# Patient Record
Sex: Male | Born: 1970 | Race: Black or African American | Hispanic: No | Marital: Single | Smoking: Former smoker
Health system: Southern US, Community
[De-identification: ages and names within clinical notes are randomized; demographics above are authoritative.]

## PROBLEM LIST (undated history)

## (undated) DIAGNOSIS — E7800 Pure hypercholesterolemia, unspecified: Secondary | ICD-10-CM

## (undated) DIAGNOSIS — I509 Heart failure, unspecified: Secondary | ICD-10-CM

## (undated) DIAGNOSIS — I10 Essential (primary) hypertension: Secondary | ICD-10-CM

## (undated) DIAGNOSIS — I5022 Chronic systolic (congestive) heart failure: Secondary | ICD-10-CM

---

## 2018-05-12 DIAGNOSIS — F1410 Cocaine abuse, uncomplicated: Secondary | ICD-10-CM | POA: Diagnosis present

## 2019-11-23 DIAGNOSIS — M5412 Radiculopathy, cervical region: Secondary | ICD-10-CM | POA: Insufficient documentation

## 2019-11-23 DIAGNOSIS — I10 Essential (primary) hypertension: Secondary | ICD-10-CM | POA: Diagnosis present

## 2019-11-23 DIAGNOSIS — M5416 Radiculopathy, lumbar region: Secondary | ICD-10-CM | POA: Insufficient documentation

## 2020-02-10 DIAGNOSIS — E7800 Pure hypercholesterolemia, unspecified: Secondary | ICD-10-CM | POA: Diagnosis present

## 2020-05-15 DIAGNOSIS — I441 Atrioventricular block, second degree: Secondary | ICD-10-CM | POA: Insufficient documentation

## 2020-05-18 DIAGNOSIS — I5022 Chronic systolic (congestive) heart failure: Secondary | ICD-10-CM | POA: Diagnosis present

## 2020-05-18 DIAGNOSIS — I5020 Unspecified systolic (congestive) heart failure: Secondary | ICD-10-CM | POA: Diagnosis present

## 2020-05-18 HISTORY — PX: CARDIAC CATHETERIZATION: SHX172

## 2020-08-23 ENCOUNTER — Other Ambulatory Visit: Payer: Self-pay | Admitting: Registered Nurse

## 2020-08-23 ENCOUNTER — Ambulatory Visit (HOSPITAL_COMMUNITY)
Admission: EM | Admit: 2020-08-23 | Discharge: 2020-08-23 | Disposition: A | Discharge status: Other Acute Inpatient Hospital | Payer: No Typology Code available for payment source | Attending: Psychiatry | Admitting: Psychiatry

## 2020-08-23 ENCOUNTER — Emergency Department (HOSPITAL_COMMUNITY)
Admission: EM | Admit: 2020-08-23 | Discharge: 2020-08-25 | Disposition: A | Discharge status: Other Acute Inpatient Hospital | Payer: No Typology Code available for payment source | Attending: Emergency Medicine | Admitting: Emergency Medicine

## 2020-08-23 ENCOUNTER — Emergency Department (HOSPITAL_COMMUNITY): Payer: No Typology Code available for payment source

## 2020-08-23 ENCOUNTER — Other Ambulatory Visit: Payer: Self-pay

## 2020-08-23 DIAGNOSIS — F1014 Alcohol abuse with alcohol-induced mood disorder: Secondary | ICD-10-CM | POA: Insufficient documentation

## 2020-08-23 DIAGNOSIS — Z20822 Contact with and (suspected) exposure to covid-19: Secondary | ICD-10-CM | POA: Insufficient documentation

## 2020-08-23 DIAGNOSIS — Y906 Blood alcohol level of 120-199 mg/100 ml: Secondary | ICD-10-CM | POA: Insufficient documentation

## 2020-08-23 DIAGNOSIS — R45851 Suicidal ideations: Secondary | ICD-10-CM | POA: Insufficient documentation

## 2020-08-23 DIAGNOSIS — F1410 Cocaine abuse, uncomplicated: Secondary | ICD-10-CM

## 2020-08-23 DIAGNOSIS — Z0279 Encounter for issue of other medical certificate: Secondary | ICD-10-CM | POA: Diagnosis present

## 2020-08-23 DIAGNOSIS — R079 Chest pain, unspecified: Secondary | ICD-10-CM

## 2020-08-23 DIAGNOSIS — R454 Irritability and anger: Secondary | ICD-10-CM | POA: Diagnosis not present

## 2020-08-23 DIAGNOSIS — R0789 Other chest pain: Secondary | ICD-10-CM | POA: Insufficient documentation

## 2020-08-23 DIAGNOSIS — F10920 Alcohol use, unspecified with intoxication, uncomplicated: Secondary | ICD-10-CM

## 2020-08-23 DIAGNOSIS — R45850 Homicidal ideations: Secondary | ICD-10-CM | POA: Insufficient documentation

## 2020-08-23 DIAGNOSIS — F1024 Alcohol dependence with alcohol-induced mood disorder: Secondary | ICD-10-CM | POA: Insufficient documentation

## 2020-08-23 DIAGNOSIS — F419 Anxiety disorder, unspecified: Secondary | ICD-10-CM | POA: Insufficient documentation

## 2020-08-23 DIAGNOSIS — F1094 Alcohol use, unspecified with alcohol-induced mood disorder: Secondary | ICD-10-CM | POA: Diagnosis not present

## 2020-08-23 DIAGNOSIS — F1020 Alcohol dependence, uncomplicated: Secondary | ICD-10-CM | POA: Diagnosis present

## 2020-08-23 DIAGNOSIS — F1994 Other psychoactive substance use, unspecified with psychoactive substance-induced mood disorder: Secondary | ICD-10-CM

## 2020-08-23 DIAGNOSIS — F1021 Alcohol dependence, in remission: Secondary | ICD-10-CM

## 2020-08-23 LAB — COMPREHENSIVE METABOLIC PANEL
ALT: 41 U/L (ref 0–44)
AST: 64 U/L — ABNORMAL HIGH (ref 15–41)
Albumin: 4.3 g/dL (ref 3.5–5.0)
Alkaline Phosphatase: 81 U/L (ref 38–126)
Anion gap: 11 (ref 5–15)
BUN: 11 mg/dL (ref 6–20)
CO2: 23 mmol/L (ref 22–32)
Calcium: 9.3 mg/dL (ref 8.9–10.3)
Chloride: 102 mmol/L (ref 98–111)
Creatinine, Ser: 1.29 mg/dL — ABNORMAL HIGH (ref 0.61–1.24)
GFR, Estimated: >60 mL/min (ref >60)
Glucose, Bld: 82 mg/dL (ref 70–99)
Potassium: 3.7 mmol/L (ref 3.5–5.1)
Sodium: 136 mmol/L (ref 135–145)
Total Bilirubin: 1 mg/dL (ref 0.3–1.2)
Total Protein: 8.1 g/dL (ref 6.5–8.1)

## 2020-08-23 LAB — CBC WITH DIFFERENTIAL/PLATELET
Abs Immature Granulocytes: 0.02 10*3/uL (ref 0.00–0.07)
Basophils Absolute: 0 10*3/uL (ref 0.0–0.1)
Basophils Relative: 0 %
Eosinophils Absolute: 0.1 10*3/uL (ref 0.0–0.5)
Eosinophils Relative: 1 %
HCT: 47.2 % (ref 39.0–52.0)
Hemoglobin: 15.8 g/dL (ref 13.0–17.0)
Immature Granulocytes: 0 %
Lymphocytes Relative: 26 %
Lymphs Abs: 2.1 10*3/uL (ref 0.7–4.0)
MCH: 31.9 pg (ref 26.0–34.0)
MCHC: 33.5 g/dL (ref 30.0–36.0)
MCV: 95.4 fL (ref 80.0–100.0)
Monocytes Absolute: 0.9 10*3/uL (ref 0.1–1.0)
Monocytes Relative: 12 %
Neutro Abs: 4.8 10*3/uL (ref 1.7–7.7)
Neutrophils Relative %: 61 %
Platelets: 170 10*3/uL (ref 150–400)
RBC: 4.95 MIL/uL (ref 4.22–5.81)
RDW: 13.7 % (ref 11.5–15.5)
WBC: 7.9 10*3/uL (ref 4.0–10.5)
nRBC: 0 % (ref 0.0–0.2)

## 2020-08-23 LAB — RESP PANEL BY RT-PCR (FLU A&B, COVID) ARPGX2
Influenza A by PCR: NEGATIVE
Influenza B by PCR: NEGATIVE
SARS Coronavirus 2 by RT PCR: NEGATIVE

## 2020-08-23 LAB — TROPONIN I (HIGH SENSITIVITY)
Troponin I (High Sensitivity): 9 ng/L (ref <18)
Troponin I (High Sensitivity): 8 ng/L (ref <18)

## 2020-08-23 LAB — ETHANOL: Alcohol, Ethyl (B): 189 mg/dL — ABNORMAL HIGH (ref <10)

## 2020-08-23 LAB — ACETAMINOPHEN LEVEL: Acetaminophen (Tylenol), Serum: <10 ug/mL — ABNORMAL LOW (ref 10–30)

## 2020-08-23 LAB — SALICYLATE LEVEL: Salicylate Lvl: <7 mg/dL — ABNORMAL LOW (ref 7.0–30.0)

## 2020-08-23 MED ORDER — LORAZEPAM 1 MG PO TABS
0.0000 mg | ORAL_TABLET | Freq: Four times a day (QID) | ORAL | Status: DC
  Administered 2020-08-23: 1 mg via ORAL
  Filled 2020-08-23: qty 1

## 2020-08-23 MED ORDER — ACETAMINOPHEN 325 MG PO TABS
650.0000 mg | ORAL_TABLET | ORAL | Status: DC | PRN
  Administered 2020-08-24: 650 mg via ORAL
  Filled 2020-08-23: qty 2

## 2020-08-23 MED ORDER — THIAMINE HCL 100 MG/ML IJ SOLN: 100.0000 mg | Freq: Every day | INTRAMUSCULAR | Status: DC

## 2020-08-23 MED ORDER — LORAZEPAM 2 MG/ML IJ SOLN: 0.0000 mg | Freq: Four times a day (QID) | INTRAMUSCULAR | Status: DC

## 2020-08-23 MED ORDER — LORAZEPAM 1 MG PO TABS: 0.0000 mg | ORAL_TABLET | Freq: Two times a day (BID) | ORAL | Status: DC

## 2020-08-23 MED ORDER — THIAMINE HCL 100 MG PO TABS
100.0000 mg | ORAL_TABLET | Freq: Every day | ORAL | Status: DC
  Administered 2020-08-23 – 2020-08-25 (×3): 100 mg via ORAL
  Filled 2020-08-23 (×3): qty 1

## 2020-08-23 MED ORDER — LORAZEPAM 2 MG/ML IJ SOLN: 0.0000 mg | Freq: Two times a day (BID) | INTRAMUSCULAR | Status: DC

## 2020-08-23 MED ORDER — NICOTINE 21 MG/24HR TD PT24: 21.0000 mg | MEDICATED_PATCH | TRANSDERMAL | Status: DC | PRN

## 2020-08-23 MED ORDER — ALUM & MAG HYDROXIDE-SIMETH 200-200-20 MG/5ML PO SUSP: 30.0000 mL | Freq: Four times a day (QID) | ORAL | Status: DC | PRN

## 2020-08-23 MED ORDER — ONDANSETRON HCL 4 MG PO TABS: 4.0000 mg | ORAL_TABLET | Freq: Three times a day (TID) | ORAL | Status: DC | PRN

## 2020-08-23 NOTE — Discharge Instructions (Addendum)
Patient is being tranported to the [facility] via safe transport

## 2020-08-23 NOTE — BH Assessment (Signed)
TTS triage: Patient presents to [facility] via law enforcement. He is tearful and states he has felt suicidal since 02/21/2022 when his nephew passed away. He states earlier today he thought of overdosing or jumping in front of a train. He denies HI/AVH. He reports having 3-4 years of sobriety until last Friday when he relapsed on alcohol. He also states he used some cocaine. He drank 1/2 gallon of liquor PTA.  Patient is emergent. Vernard Gambles, PMHNP to see to determine if he needs to go to ED.

## 2020-08-23 NOTE — ED Provider Notes (Signed)
Emergency Department Provider Note   I have reviewed the triage vital signs and the nursing notes.   HISTORY  Chief Complaint Suicidal   HPI Bruce Moreno is a 50 y.o. male with PMH of HTN and polysubstance abuse to the emergency department from [facility] for medical clearance.  He was brought there voluntarily by [employer] after they found him tearful and describing both suicidal and some homicidal ideation toward his wife.  He described relapsing on alcohol recently after several years sober.  He also endorses using cocaine over the weekend.  He describes some chest discomfort but tells me that was earlier this morning he is not having any active chest pain or trouble breathing.  No fevers or chills.  He has been drinking very heavily over the past several days drinking up to 1/2 gallon and in a 24-hour period. Plan from psychiatry team is AM re-evaluation once medically clear.   No past medical history on file.  There are no problems to display for this patient.   Allergies Patient has no known allergies.  No family history on file.  Social History    Review of Systems  Constitutional: No fever/chills Eyes: No visual changes. ENT: No sore throat. Cardiovascular: Positive chest pain (resolved).  Respiratory: Denies shortness of breath. Gastrointestinal: No abdominal pain.  No nausea, no vomiting.  No diarrhea.  No constipation. Genitourinary: Negative for dysuria. Musculoskeletal: Negative for back pain. Skin: Negative for rash. Neurological: Negative for headaches, focal weakness or numbness. Psychiatric: Positive SI/HI.   10-point ROS otherwise negative.  ____________________________________________   PHYSICAL EXAM:  VITAL SIGNS: ED Triage Vitals [08/23/20 1745]  Enc Vitals Group     BP (!) 136/92     Pulse Rate (!) 102     Resp 14     Temp 99.1 F (37.3 C)     Temp Source Oral     SpO2 97 %   Constitutional: Alert and oriented. Well appearing and in no acute  distress. Eyes: Conjunctivae are normal.  Head: Atraumatic. Nose: No congestion/rhinnorhea. Mouth/Throat: Mucous membranes are moist.   Neck: No stridor.   Cardiovascular: Normal rate, regular rhythm. Good peripheral circulation. Grossly normal heart sounds.   Respiratory: Normal respiratory effort.  No retractions. Lungs CTAB. Gastrointestinal: No distention.  Musculoskeletal: No gross deformities of extremities. Neurologic:  Normal speech and language. No gross focal neurologic deficits are appreciated.  Skin:  Skin is warm, dry and intact. No rash noted. Psychiatric: Poor eye contact. Intermittently agitated but able to re-direct. Speech somewhat pressured at times.   ____________________________________________   LABS (all labs ordered are listed, but only abnormal results are displayed)  Labs Reviewed  COMPREHENSIVE METABOLIC PANEL - Abnormal; Notable for the following components:      Result Value   Creatinine, Ser 1.29 (*)    AST 64 (*)    All other components within normal limits  ETHANOL - Abnormal; Notable for the following components:   Alcohol, Ethyl (B) 189 (*)    All other components within normal limits  ACETAMINOPHEN LEVEL - Abnormal; Notable for the following components:   Acetaminophen (Tylenol), Serum <10 (*)    All other components within normal limits  SALICYLATE LEVEL - Abnormal; Notable for the following components:   Salicylate Lvl <7.0 (*)    All other components within normal limits  RESP PANEL BY RT-PCR (FLU A&B, COVID) [id number]  CBC WITH DIFFERENTIAL/PLATELET  RAPID URINE DRUG SCREEN, HOSP PERFORMED  TROPONIN I (HIGH SENSITIVITY)  TROPONIN I (  HIGH SENSITIVITY)   ____________________________________________  EKG   EKG Interpretation  Date/Time:  Tuesday August 23 2020 17:37:31 EDT Ventricular Rate:  104 PR Interval:  138 QRS Duration: 86 QT Interval:  366 QTC Calculation: 481 R Axis:   96 Text Interpretation: Sinus tachycardia Right atrial  enlargement Rightward axis Pulmonary disease pattern Biventricular hypertrophy Abnormal ECG No old tracing to compare Confirmed by Alona Bene [phone]) on 08/23/2020 6:50:05 PM        ____________________________________________  RADIOLOGY  DG Chest 2 View  Result Date: 08/23/2020 CLINICAL DATA:  Suicidal thoughts. EXAM: CHEST - 2 VIEW COMPARISON:  None. FINDINGS: The lungs are mildly hyperinflated. There is no evidence of acute infiltrate, pleural effusion or pneumothorax. The cardiac silhouette is borderline in size. The visualized skeletal structures are unremarkable. IMPRESSION: No active cardiopulmonary disease. Electronically Signed   By: Aram Candela M.D.   On: 08/23/2020 19:24    ____________________________________________   PROCEDURES  Procedure(s) performed:   Procedures  None  ____________________________________________   INITIAL IMPRESSION / ASSESSMENT AND PLAN / ED COURSE  Pertinent labs & imaging results that were available during my care of the patient were reviewed by me and considered in my medical decision making (see chart for details).   Patient presents to the emergency department from behavioral health urgent care.  They evaluated the patient and plan to reevaluate in the morning.  He was sent here for medical clearance.  Chest pain was earlier today.  EKG shows some sinus tachycardia but no acute ischemic change.  Plan for chest x-ray and serial troponins along with COVID swab. Will monitor CIWA but by history only returned to drinking in the last several days after years of sobriety. Low risk for complicated EtOH withdrawal but will monitor.   CXR and labs reviewed. Med clear for psychiatry disposition.  ____________________________________________  FINAL CLINICAL IMPRESSION(S) / ED DIAGNOSES  Final diagnoses:  Chest pain     MEDICATIONS GIVEN DURING THIS VISIT:  Medications  LORazepam (ATIVAN) injection 0-4 mg (0 mg Intravenous Not Given  08/24/20 0800)    Or  LORazepam (ATIVAN) tablet 0-4 mg ( Oral See Alternative 08/24/20 0800)  LORazepam (ATIVAN) injection 0-4 mg (has no administration in time range)    Or  LORazepam (ATIVAN) tablet 0-4 mg (has no administration in time range)  thiamine tablet 100 mg (100 mg Oral Given 08/24/20 1043)    Or  thiamine (B-1) injection 100 mg ( Intravenous See Alternative 08/24/20 1043)  nicotine (NICODERM CQ - dosed in mg/24 hours) patch 21 mg (has no administration in time range)  alum & mag hydroxide-simeth (MAALOX/MYLANTA) 200-200-20 MG/5ML suspension 30 mL (has no administration in time range)  ondansetron (ZOFRAN) tablet 4 mg (has no administration in time range)  acetaminophen (TYLENOL) tablet 650 mg (has no administration in time range)    Note:  This document was prepared using Dragon voice recognition software and may include unintentional dictation errors.  Alona Bene, MD, [facility] Emergency Medicine    Aaliyana Fredericks, Arlyss Repress, MD 08/24/20 1115

## 2020-08-23 NOTE — ED Notes (Signed)
Safe transport called for transportation services to [facility]. Safety maintained.

## 2020-08-23 NOTE — ED Triage Notes (Signed)
Pt here with c/o SI and etoh abuse along with some cp

## 2020-08-23 NOTE — ED Provider Notes (Signed)
Emergency Medicine Provider Triage Evaluation Note  Bruce Moreno , a 50 y.o. male  was evaluated in triage.  Pt complains of si with plan to drink himself to death. States he has no will to live after his father passed, he has been drinking daily for 4 days. He also did some cocaine. He had cp that has resolved.  Review of Systems  Positive: Chest pain (resolved), si Negative: hi  Physical Exam  BP (!) 136/92   Pulse (!) 102   Temp 99.1 F (37.3 C) (Oral)   Resp 14   SpO2 97%  Gen:   Awake, no distress   Resp:  Normal effort  MSK:   Moves extremities without difficulty  Other:  Appears intoxicated, tearful  Medical Decision Making  Medically screening exam initiated at 5:51 PM.  Appropriate orders placed.  Bruce Moreno was informed that the remainder of the evaluation will be completed by another provider, this initial triage assessment does not replace that evaluation, and the importance of remaining in the ED until their evaluation is complete.     Karrie Meres, [place] 08/23/20 1753    Terald Sleeper, MD 08/24/20 [phone]

## 2020-08-23 NOTE — BH Assessment (Signed)
Comprehensive Clinical Assessment (CCA) Note  08/23/2020 Bruce Moreno [id number]  Patient is a 50 year old male presenting voluntarily to [facility] via [employer]. Patient is tearful upon arrival. He states, "I can't do it anymore. I don't want to be alive anymore." He indicated this morning he was considering overdosing on his blood pressure medication or jumping in front a train. Patient states he has felt increasingly depressed and suicidal since his nephew passed away in 04-01-22. He states he passed of heart disease and his wife made the decision to "pull the plug." He endorses passive HI toward her. He states he was clean and sober for 3-4 years but on Friday relapsed on alcohol and has been "on a binge" since. He reports drinking a half gallon of liquor earlier this date. He also states he used crack cocaine for the first time over the weekend. He states, "I've got to get some help. I don't want to live anymore." Patient denies AVH. He denies any prior mental health treatment.   Vernard Gambles, PMHNP recommends patient be transferred to ED for overnight observation and stabilization.  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 Legal System  What Is the Reason for Your Visit/Call Today? suicidal  How Long Has This Been Causing You Problems? > than 6 months  What Do You Feel Would Help You the Most Today? Treatment for Depression or other mood problem; Stress Management; Medication(s)   Have You Recently Had Any Thoughts About Hurting Yourself? Yes  Are You Planning to Commit Suicide/Harm Yourself At This time? Yes   Have you Recently Had Thoughts About Hurting Someone Karolee Ohs? No  Are You Planning to Harm Someone at This Time? No  Explanation: No data recorded  Have You Used Any Alcohol or Drugs in the Past 24 Hours? Yes  How Long Ago Did You  Use Drugs or Alcohol? No data recorded What Did You Use and How Much? 1/2 gallon of liquor   Do You Currently Have a Therapist/Psychiatrist? No  Name of Therapist/Psychiatrist: No data recorded  Have You Been Recently Discharged From Any Office Practice or Programs? No  Explanation of Discharge From Practice/Program: No data recorded    CCA Screening Triage Referral Assessment Type of Contact: Face-to-Face  Telemedicine Service Delivery:   Is this Initial or Reassessment? No data recorded Date Telepsych consult ordered in CHL:  No data recorded Time Telepsych consult ordered in CHL:  No data recorded Location of Assessment: [facility] Assessment Services  Provider Location: [facility] Assessment Services   Collateral Involvement: NA   Does Patient Have a Automotive engineer Guardian? No data recorded Name and Contact of Legal Guardian: No data recorded If Minor and Not Living with Parent(s), Who has Custody? No data recorded Is CPS involved or ever been involved? Never  Is APS involved or ever been involved? Never   Patient Determined To Be At Risk for Harm To Self or Others Based on Review of Patient Reported Information or Presenting Complaint? Yes, for Self-Harm  Method: No data recorded Availability of Means: No data recorded Intent: No data recorded Notification Required: No data recorded Additional Information for Danger to Others Potential: No data recorded Additional Comments for Danger to Others Potential: No data recorded Are There Guns or Other Weapons in Your Home? No data recorded Types of Guns/Weapons: No data recorded Are These Weapons  Safely Secured?                            No data recorded Who Could Verify You Are Able To Have These Secured: No data recorded Do You Have any Outstanding Charges, Pending Court Dates, Parole/Probation? No data recorded Contacted To Inform of Risk of Harm To Self or Others: No data recorded   Does Patient Present under  Involuntary Commitment? No  IVC Papers Initial File Date: No data recorded  [place] of Residence: Other (Comment) (orange)   Patient Currently Receiving the Following Services: Not Receiving Services   Determination of Need: Emergent (2 hours)   Options For Referral: Medication Management; Inpatient Hospitalization; BH Urgent Care     CCA Biopsychosocial Patient Reported Schizophrenia/Schizoaffective Diagnosis in Past: No   Strengths: motivated for treatment   Mental Health Symptoms Depression:   Change in energy/activity; Difficulty Concentrating; Fatigue; Hopelessness; Weight gain/loss; Tearfulness; Sleep (too much or little); Irritability; Increase/decrease in appetite; Worthlessness   Duration of Depressive symptoms:  Duration of Depressive Symptoms: Greater than two weeks   Mania:   None   Anxiety:    None   Psychosis:   None   Duration of Psychotic symptoms:    Trauma:   None   Obsessions:   None   Compulsions:   None   Inattention:   N/A   Hyperactivity/Impulsivity:   N/A   Oppositional/Defiant Behaviors:   N/A   Emotional Irregularity:   N/A   Other Mood/Personality Symptoms:  No data recorded   Mental Status Exam Appearance and self-care  Stature:   Average   Weight:   Average weight   Clothing:   Neat/clean   Grooming:   Normal   Cosmetic use:   None   Posture/gait:   Slumped   Motor activity:   Not Remarkable   Sensorium  Attention:   Persistent   Concentration:   Preoccupied   Orientation:   X5   Recall/memory:   Normal   Affect and Mood  Affect:   Tearful; Depressed   Mood:   Depressed   Relating  Eye contact:   Normal   Facial expression:   Responsive   Attitude toward examiner:   Cooperative   Thought and Language  Speech flow:  Clear and Coherent   Thought content:   Appropriate to Mood and Circumstances   Preoccupation:   None   Hallucinations:   None   Organization:  No  data recorded  [employer] of Knowledge:   Good   Intelligence:   Average   Abstraction:   Normal   Judgement:   Impaired   Reality Testing:   Distorted   Insight:   Lacking   Decision Making:   Impulsive   Social Functioning  Social Maturity:   Responsible   Social Judgement:   Normal   Stress  Stressors:   Grief/losses; Illness   Coping Ability:   Deficient supports   Skill Deficits:   None   Supports:   Support needed     Religion: Religion/Spirituality Are You A Religious Person?: No  Leisure/Recreation: Leisure / Recreation Do You Have Hobbies?: No  Exercise/Diet: Exercise/Diet Do You Exercise?: No Have You Gained or Lost A Significant Amount of Weight in the Past Six Months?: No Do You Follow a Special Diet?: No Do You Have Any Trouble Sleeping?: No   CCA Employment/Education Employment/Work Situation: Employment / Work Situation Employment Situation:  (  UTA) Patient's Job has Been Impacted by Current Illness:  (UTA) Has Patient ever Been in the [employer]?:  (UTA)  Education: Education Is Patient Currently Attending School?:  (UTA) Last Grade Completed:  (UTA) Did You Attend College?:  (UTA) Did You Have An Individualized Education Program (IIEP):  (UTA) Did You Have Any Difficulty At School?:  (UTA) Patient's Education Has Been Impacted by Current Illness:  (UTA)   CCA Family/Childhood History Family and Relationship History: Family history Marital status: Single Does patient have children?: Yes How many children?: 1 How is patient's relationship with their children?: estranged daughter  Childhood History:  Childhood History By whom was/is the patient raised?:  (UTA) Did patient suffer any verbal/emotional/physical/sexual abuse as a child?: No Did patient suffer from severe childhood neglect?: No Has patient ever been sexually abused/assaulted/raped as an adolescent or adult?: No Was the patient ever a  victim of a crime or a disaster?: No Witnessed domestic violence?: No Has patient been affected by domestic violence as an adult?: No  Child/Adolescent Assessment:     CCA Substance Use Alcohol/Drug Use: Alcohol / Drug Use Pain Medications: see MAR Prescriptions: see MAR Over the Counter: see <AR History of alcohol / drug use?: Yes Longest period of sobriety (when/how long): 3-4 years, relapsed on 7/29 Substance #1 Name of Substance 1: alcohol- relapsed on Friday                       ASAM's:  Six Dimensions of Multidimensional Assessment  Dimension 1:  Acute Intoxication and/or Withdrawal Potential:      Dimension 2:  Biomedical Conditions and Complications:      Dimension 3:  Emotional, Behavioral, or Cognitive Conditions and Complications:     Dimension 4:  Readiness to Change:     Dimension 5:  Relapse, Continued use, or Continued Problem Potential:     Dimension 6:  Recovery/Living Environment:     ASAM Severity Score:    ASAM Recommended Level of Treatment:     Substance use Disorder (SUD)    Recommendations for Services/Supports/Treatments:    Discharge Disposition:    DSM5 Diagnoses: There are no problems to display for this patient.    Referrals to Alternative Service(s): Referred to Alternative Service(s):   Place:   Date:   Time:    Referred to Alternative Service(s):   Place:   Date:   Time:    Referred to Alternative Service(s):   Place:   Date:   Time:    Referred to Alternative Service(s):   Place:   Date:   Time:     Celedonio Miyamoto, LCSW

## 2020-08-23 NOTE — ED Notes (Signed)
Pt transferred to [facility] via safe transport. Safety maintained.

## 2020-08-23 NOTE — ED Provider Notes (Signed)
Behavioral Health Urgent Care Medical Screening Exam  Patient Name: Bruce Moreno MRN: [id number] Date of Evaluation: 08/23/20 Chief Complaint:   Diagnosis:  Final diagnoses:  Alcohol abuse with alcohol-induced mood disorder (HCC)    History of Present illness: Bruce Moreno is a 50 y.o. male patient presented to [facility] voluntarily as a walk in  accompanied by [employer] with complaints of "if I don't get some help I am going to kill myself before this day ends".  Bruce Moreno, 50 y.o., male patient seen face to face by this provider, consulted with Bruce Moreno; and chart reviewed on 08/23/20.  On evaluation Bruce Moreno reports he has been sober for 3 years.  States 1 week ago he started binge drinking.  States, "I drink a lot every day I do not know how much".  States today he drank one half a gallon of liquor.  Reports he is also using "crack".  Reports since he lost his nephew in January his depression has worsened.  During evaluation Bruce Moreno is in sitting position in no acute distress. He is inattentive and easily distracted.  He is disheveled, with dirty clothes.  Patient appears intoxicated.  He makes fleeting eye contact. His speech is rapid and pressured at times, but clear and coherent. He gets agitated with questions and states, "are yall going to help me or not".  He is alert, oriented x 4, anxious, but cooperative.  His mood is depressed with congruent affect.  He does not appear to be responding to internal/external stimuli or delusional thoughts. Denies auditory and visual hallucinations. Patient denies self-harm/homicidal ideation, psychosis, and paranoia.  Patient endorses suicidal ideations states, "I am just sick of his life I am sick of it all".  Patient would not elaborate on any specific plan with this writer states, "does it matter".  Then states,"if I don't get some help I am going to kill myself before this day ends".   Per Bruce Miyamoto Bruce Moreno note on 08/23/2020 during TTS  assessment patient endorsed a specific plan for suicide. "He states earlier today he thought of overdosing or jumping in front of a train".  Patient reports he does have congestive heart failure.  Patients feet and ankles had no signs of edema.  Patient denies chest pain.  Patient states he has been living in a hotel. States he lives in [place].   Psychiatric Specialty Exam  Presentation  General Appearance:Disheveled  Eye Contact:Fleeting  Speech:Clear and Coherent; Pressured  Speech Volume:Normal  Handedness:Right   Mood and Affect  Mood:Anxious; Depressed; Irritable  Affect:Congruent   Thought Process  Thought Processes:Coherent  Descriptions of Associations:Intact  Orientation:Full (Time, Place and Person)  Thought Content:Logical    Hallucinations:None  Ideas of Reference:None  Suicidal Thoughts:Yes, Active With Intent (patient will not answer if he has a plan or means)  Homicidal Thoughts:No   Sensorium  Memory:Immediate Fair; Recent Fair; Remote Fair  Judgment:Poor  Insight:Poor   Executive Functions  Concentration:Fair  Attention Span:Poor  Recall:Fair  Fund of Knowledge:Fair  Language:Fair   Psychomotor Activity  Psychomotor Activity:Normal   Assets  Assets:Communication Skills; Desire for Improvement; Resilience   Sleep  Sleep:Fair  Number of hours:  No data recorded  No data recorded  Physical Exam: Physical Exam HENT:     Head: Normocephalic.     Nose: No congestion.  Eyes:     General:        Right eye: No discharge.        Left eye: No  discharge.     Conjunctiva/sclera: Conjunctivae normal.  Cardiovascular:     Rate and Rhythm: Tachycardia present.  Pulmonary:     Effort: Pulmonary effort is normal. No respiratory distress.  Musculoskeletal:        General: No swelling. Normal range of motion.     Cervical back: Normal range of motion.  Skin:    General: Skin is warm and dry.     Coloration: Skin is  not jaundiced or pale.  Neurological:     Mental Status: He is alert and oriented to person, place, and time.  Psychiatric:        Attention and Perception: Perception normal. He is inattentive.        Mood and Affect: Mood is anxious and depressed.        Speech: Speech is rapid and pressured (at times).        Behavior: Behavior is agitated.        Thought Content: Thought content includes suicidal ideation. Thought content includes suicidal plan.        Cognition and Memory: Cognition normal.        Judgment: Judgment is impulsive.   Review of Systems  Constitutional: Negative.  Negative for chills and fever.  HENT: Negative.  Negative for hearing loss.   Respiratory: Negative.  Negative for cough.   Cardiovascular: Negative.  Negative for chest pain.  Musculoskeletal: Negative.   Skin: Negative.   Neurological: Negative.   Psychiatric/Behavioral:  Positive for depression. The patient is nervous/anxious.   Blood pressure 125/88, pulse (!) 125, temperature 98.7 F (37.1 C), resp. rate 16, SpO2 96 %. There is no height or weight on file to calculate BMI.  Musculoskeletal: Strength & Muscle Tone: within normal limits Gait & Station: normal Patient leans: N/A  [facility] MSE Discharge Disposition for Follow up and Recommendations: Based on my evaluation the patient appears to have an emergency medical condition for which I recommend the patient be transferred to the emergency department for further evaluation.   Patient will be transferred to [facility] ED for medical clearance, due to patient reports he drank one half a gallon of liquor, elevated BP and heart rate.   Patient does not meet the requirements for [facility], patient states he lives in [place] and is not a [facility] resident.  States he has a brother that lives here in [place] that he visits from time to time. That could be the address he gave for registration.  Recommendation of overnight observation.  Patient has been seen  by TTS. TTS consult is in patient will be reevaluated in the a.m. by psychiatry.  Ardis Hughs, NP 08/23/2020, 6:17 PM

## 2020-08-23 NOTE — ED Notes (Signed)
Patient placed on hospital bed for comfort and snack given-Monique,RN

## 2020-08-23 NOTE — ED Triage Notes (Signed)
Pt also states that he has been doing a lot of cocaine and drinking vodka

## 2020-08-24 ENCOUNTER — Encounter (HOSPITAL_COMMUNITY): Payer: Self-pay | Admitting: Registered Nurse

## 2020-08-24 DIAGNOSIS — F1020 Alcohol dependence, uncomplicated: Secondary | ICD-10-CM

## 2020-08-24 DIAGNOSIS — F1021 Alcohol dependence, in remission: Secondary | ICD-10-CM

## 2020-08-24 DIAGNOSIS — F1094 Alcohol use, unspecified with alcohol-induced mood disorder: Secondary | ICD-10-CM

## 2020-08-24 LAB — RAPID URINE DRUG SCREEN, HOSP PERFORMED
Amphetamines: NOT DETECTED
Barbiturates: NOT DETECTED
Benzodiazepines: NOT DETECTED
Cocaine: POSITIVE — AB
Opiates: NOT DETECTED
Tetrahydrocannabinol: NOT DETECTED

## 2020-08-24 MED ORDER — MELATONIN 5 MG PO TABS
5.0000 mg | ORAL_TABLET | Freq: Every day | ORAL | Status: DC
  Administered 2020-08-24: 5 mg via ORAL
  Filled 2020-08-24: qty 1

## 2020-08-24 NOTE — Consult Note (Signed)
Telepsych Consultation   Reason for Consult:  Suicidal ideation Referring Physician:  Karrie Meres, PA-C Location of Patient: [facility] ED Location of Provider: Other: [facility]  Patient Identification: Bruce Moreno MRN:  [id number] Principal Diagnosis: Alcohol-induced mood disorder with depressive symptoms (HCC) Diagnosis:  Principal Problem:   Alcohol-induced mood disorder with depressive symptoms (HCC) Active Problems:   Alcohol use disorder, severe, dependence (HCC)   Alcohol intoxication in relapsed alcoholic (HCC)   Total Time spent with patient: 30 minutes  Subjective:   Bruce Moreno is a 50 y.o. male patient admitted to [facility] ED after initially presenting to [facility] voluntarily via [employer] with complaints of suicidal ideation and relapse on alcohol after 3 years of sobriety.    HPI:  Bruce Moreno, 50 y.o., male patient seen via tele health by this provider, consulted with Dr. Nelly Rout; and chart reviewed on 08/24/20.  On evaluation Bruce Moreno reports he is in the hospital because he needs some help.  Patient reports he has been sober for 3 years and about a week ago he relapsed after the death of his nephew.  "When he died I died, he was like a son to me."  Patient continues to endorse suicidal ideation with a plan to drive his car off a bridge.  Patient denies prior suicide attempt.  Patient also denies homicidal ideation, auditory/visual hallucinations, and paranoia.  Patient reports he has had rehab services in the past about 10 years ago and states that he has had 1 psychiatric hospitalization years and years ago but does not remember what for.  Patient states he has been staying in a [facility] house in [place] for the last 2 years and he was in [place] visiting his brother when he relapsed.  Patient is unable to contract for safety stating that if he was to leave today more than likely he would do something to kill himself because his life cannot go on like this.  We will  continue to recommend inpatient psychiatric treatment.  Past Psychiatric History: Depression, alcohol use disorder  Risk to Self: Yes Risk to Others: No Prior Inpatient Therapy: Yes Prior Outpatient Therapy: Yes  Past Medical History: History reviewed. No pertinent past medical history. History reviewed. No pertinent surgical history. Family History: History reviewed. No pertinent family history. Family Psychiatric  History: None noted Social History:  Social History   Substance and Sexual Activity  Alcohol Use None     Social History   Substance and Sexual Activity  Drug Use Not on file    Social History   Socioeconomic History   Marital status: Single    Spouse name: Not on file   Number of children: Not on file   Years of education: Not on file   Highest education level: Not on file  Occupational History   Not on file  Tobacco Use   Smoking status: Not on file   Smokeless tobacco: Not on file  Substance and Sexual Activity   Alcohol use: Not on file   Drug use: Not on file   Sexual activity: Not on file  Other Topics Concern   Not on file  Social History Narrative   Not on file   Social Determinants of Health   Financial Resource Strain: Not on file  Food Insecurity: Not on file  Transportation Needs: Not on file  Physical Activity: Not on file  Stress: Not on file  Social Connections: Not on file   Additional Social History:  Allergies:  No Known Allergies  Labs:  Results for orders placed or performed during the hospital encounter of 08/23/20 (from the past 48 hour(s))  Comprehensive metabolic panel     Status: Abnormal   Collection Time: 08/23/20  5:53 PM  Result Value Ref Range   Sodium 136 135 - 145 mmol/L   Potassium 3.7 3.5 - 5.1 mmol/L   Chloride 102 98 - 111 mmol/L   CO2 23 22 - 32 mmol/L   Glucose, Bld 82 70 - 99 mg/dL    Comment: Glucose reference range applies only to samples taken after fasting for at least 8 hours.   BUN 11 6 -  20 mg/dL   Creatinine, Ser 1.93 (H) 0.61 - 1.24 mg/dL   Calcium 9.3 8.9 - 79.0 mg/dL   Total Protein 8.1 6.5 - 8.1 Moreno/dL   Albumin 4.3 3.5 - 5.0 Moreno/dL   AST 64 (H) 15 - 41 U/L   ALT 41 0 - 44 U/L   Alkaline Phosphatase 81 38 - 126 U/L   Total Bilirubin 1.0 0.3 - 1.2 mg/dL   GFR, Estimated >24 >09 mL/min    Comment: (NOTE) Calculated using the CKD-EPI Creatinine Equation (2021)    Anion gap 11 5 - 15    Comment: Performed at [facility], [address] [place], [place] [address]  CBC with Differential     Status: None   Collection Time: 08/23/20  5:53 PM  Result Value Ref Range   WBC 7.9 4.0 - 10.5 K/uL   RBC 4.95 4.22 - 5.81 MIL/uL   Hemoglobin 15.8 13.0 - 17.0 Moreno/dL   HCT 99.2 42.6 - 83.4 %   MCV 95.4 80.0 - 100.0 fL   MCH 31.9 26.0 - 34.0 pg   MCHC 33.5 30.0 - 36.0 Moreno/dL   RDW 19.6 22.2 - 97.9 %   Platelets 170 150 - 400 K/uL   nRBC 0.0 0.0 - 0.2 %   Neutrophils Relative % 61 %   Neutro Abs 4.8 1.7 - 7.7 K/uL   Lymphocytes Relative 26 %   Lymphs Abs 2.1 0.7 - 4.0 K/uL   Monocytes Relative 12 %   Monocytes Absolute 0.9 0.1 - 1.0 K/uL   Eosinophils Relative 1 %   Eosinophils Absolute 0.1 0.0 - 0.5 K/uL   Basophils Relative 0 %   Basophils Absolute 0.0 0.0 - 0.1 K/uL   Immature Granulocytes 0 %   Abs Immature Granulocytes 0.02 0.00 - 0.07 K/uL    Comment: Performed at [facility], [address] [place], [place] [address]  Ethanol     Status: Abnormal   Collection Time: 08/23/20  5:53 PM  Result Value Ref Range   Alcohol, Ethyl (B) 189 (H) <10 mg/dL    Comment: (NOTE) Lowest detectable limit for serum alcohol is 10 mg/dL.  For medical purposes only. Performed at [facility], [address] [place], [place] [address]   Acetaminophen level     Status: Abnormal   Collection Time: 08/23/20  5:53 PM  Result Value Ref Range   Acetaminophen (Tylenol), Serum <10 (L) 10 - 30 ug/mL    Comment: (NOTE) Therapeutic concentrations vary significantly. A  range of 10-30 ug/mL  may be an effective concentration for many patients. However, some  are best treated at concentrations outside of this range. Acetaminophen concentrations >150 ug/mL at 4 hours after ingestion  and >50 ug/mL at 12 hours after ingestion are often associated with  toxic  reactions.  Performed at [facility], [address], [place], [place] [address]   Salicylate level     Status: Abnormal   Collection Time: 08/23/20  5:53 PM  Result Value Ref Range   Salicylate Lvl <7.0 (L) 7.0 - 30.0 mg/dL    Comment: Performed at [facility], [address] [place], [place] [address]  Troponin I (High Sensitivity)     Status: None   Collection Time: 08/23/20  5:53 PM  Result Value Ref Range   Troponin I (High Sensitivity) 9 <18 ng/L    Comment: (NOTE) Elevated high sensitivity troponin I (hsTnI) values and significant  changes across serial measurements may suggest ACS but many other  chronic and acute conditions are known to elevate hsTnI results.  Refer to the "Links" section for chest pain algorithms and additional  guidance. Performed at [facility], [address], [place], [place] [address]   Resp Panel by RT-PCR (Flu A&B, Covid) Nasopharyngeal Swab     Status: None   Collection Time: 08/23/20  5:54 PM   Specimen: Nasopharyngeal Swab; Nasopharyngeal(NP) swabs in vial transport medium  Result Value Ref Range   SARS Coronavirus 2 by RT PCR NEGATIVE NEGATIVE    Comment: (NOTE) SARS-CoV-2 target nucleic acids are NOT DETECTED.  The SARS-CoV-2 RNA is generally detectable in upper respiratory specimens during the acute phase of infection. The lowest concentration of SARS-CoV-2 viral copies this assay can detect is 138 copies/mL. A negative result does not preclude SARS-Cov-2 infection and should not be used as the sole basis for treatment or other patient management decisions. A negative result may occur with  improper specimen collection/handling,  submission of specimen other than nasopharyngeal swab, presence of viral mutation(s) within the areas targeted by this assay, and inadequate number of viral copies(<138 copies/mL). A negative result must be combined with clinical observations, patient history, and epidemiological information. The expected result is Negative.  Fact Sheet for Patients:  BloggerCourse.com  Fact Sheet for Healthcare Providers:  SeriousBroker.it  This test is no t yet approved or cleared by the [place] FDA and  has been authorized for detection and/or diagnosis of SARS-CoV-2 by FDA under an Emergency Use Authorization (EUA). This EUA will remain  in effect (meaning this test can be used) for the duration of the COVID-19 declaration under Section 564(b)(1) of the Act, 21 U.S.C.section 360bbb-3(b)(1), unless the authorization is terminated  or revoked sooner.       Influenza A by PCR NEGATIVE NEGATIVE   Influenza B by PCR NEGATIVE NEGATIVE    Comment: (NOTE) The Xpert Xpress SARS-CoV-2/FLU/RSV plus assay is intended as an aid in the diagnosis of influenza from Nasopharyngeal swab specimens and should not be used as a sole basis for treatment. Nasal washings and aspirates are unacceptable for Xpert Xpress SARS-CoV-2/FLU/RSV testing.  Fact Sheet for Patients: BloggerCourse.com  Fact Sheet for Healthcare Providers: SeriousBroker.it  This test is not yet approved or cleared by the [place] FDA and has been authorized for detection and/or diagnosis of SARS-CoV-2 by FDA under an Emergency Use Authorization (EUA). This EUA will remain in effect (meaning this test can be used) for the duration of the COVID-19 declaration under Section 564(b)(1) of the Act, 21 U.S.C. section 360bbb-3(b)(1), unless the authorization is terminated or revoked.  Performed at [facility], [address]  [place], [place] [address]   Troponin I (High Sensitivity)     Status: None   Collection Time:  08/23/20  7:53 PM  Result Value Ref Range   Troponin I (High Sensitivity) 8 <18 ng/L    Comment: (NOTE) Elevated high sensitivity troponin I (hsTnI) values and significant  changes across serial measurements may suggest ACS but many other  chronic and acute conditions are known to elevate hsTnI results.  Refer to the "Links" section for chest pain algorithms and additional  guidance. Performed at [facility], [address] [place], [place] [address]     Medications:  Current Facility-Administered Medications  Medication Dose Route Frequency Provider Last Rate Last Admin   acetaminophen (TYLENOL) tablet 650 mg  650 mg Oral Q4H PRN Bruce Moreno, Bruce RepressJoshua G, MD       alum & mag hydroxide-simeth (MAALOX/MYLANTA) 200-200-20 MG/5ML suspension 30 mL  30 mL Oral Q6H PRN Bruce Moreno, Bruce RepressJoshua G, MD       LORazepam (ATIVAN) injection 0-4 mg  0-4 mg Intravenous Q6H Bruce Moreno, Bruce RepressJoshua G, MD       Or   LORazepam (ATIVAN) tablet 0-4 mg  0-4 mg Oral Q6H Bruce Moreno, Bruce RepressJoshua G, MD   1 mg at 08/23/20 2151   [START ON 08/26/2020] LORazepam (ATIVAN) injection 0-4 mg  0-4 mg Intravenous Q12H Bruce Moreno, Bruce RepressJoshua G, MD       Or   Melene Muller[START ON 08/26/2020] LORazepam (ATIVAN) tablet 0-4 mg  0-4 mg Oral Q12H Bruce Moreno, Bruce RepressJoshua G, MD       nicotine (NICODERM CQ - dosed in mg/24 hours) patch 21 mg  21 mg Transdermal PRN Bruce Moreno, Bruce RepressJoshua G, MD       ondansetron [facility](ZOFRAN) tablet 4 mg  4 mg Oral Q8H PRN Bruce Moreno, Bruce RepressJoshua G, MD       thiamine tablet 100 mg  100 mg Oral Daily Bruce Moreno, Bruce RepressJoshua G, MD   100 mg at 08/24/20 1043   Or   thiamine (B-1) injection 100 mg  100 mg Intravenous Daily Bruce Moreno, Bruce RepressJoshua G, MD       Current Outpatient Medications  Medication Sig Dispense Refill   atorvastatin (LIPITOR) 20 MG tablet Take 40 mg by mouth daily.     furosemide (LASIX) 20 MG tablet Take 20 mg by mouth daily as needed for edema.     JARDIANCE 10 MG TABS tablet Take 10 mg by mouth daily.      metoprolol succinate (TOPROL-XL) 100 MG 24 hr tablet Take 100 mg by mouth 2 (two) times daily. Take with or immediately following a meal.     sacubitril-valsartan (ENTRESTO) 97-103 MG Take 1 tablet by mouth 2 (two) times daily.     spironolactone (ALDACTONE) 25 MG tablet Take 25 mg by mouth daily.      Musculoskeletal: Strength & Muscle Tone: within normal limits Gait & Station: normal Patient leans: N/A    Psychiatric Specialty Exam:  Presentation  General Appearance: Appropriate for Environment  Eye Contact:Good  Speech:Clear and Coherent; Normal Rate  Speech Volume:Normal  Handedness:Right   Mood and Affect  Mood:Anxious; Depressed; Hopeless  Affect:Congruent; Depressed   Thought Process  Thought Processes:Coherent; Goal Directed  Descriptions of Associations:Intact  Orientation:Full (Time, Place and Person)  Thought Content:WDL  History of Schizophrenia/Schizoaffective disorder:No  Duration of Psychotic Symptoms:No data recorded Hallucinations:Hallucinations: None  Ideas of Reference:None  Suicidal Thoughts:Suicidal Thoughts: Yes, Passive SI Active Intent and/or Plan: With Intent SI Passive Intent and/or Plan: With Intent; With Plan  Homicidal Thoughts:Homicidal Thoughts: No   Sensorium  Memory:Immediate Good; Recent Good; Remote Good  Judgment:Fair  Insight:Fair   Executive Functions  Concentration:Good  Attention Span:Good  Recall:Good  Fund of Knowledge:Good  Language:Good   Psychomotor Activity  Psychomotor Activity:Psychomotor Activity: Normal   Assets  Assets:Communication Skills; Desire for Improvement; Financial Resources/Insurance   Sleep  Sleep:Sleep: Fair    Physical Exam: Physical Exam Vitals and nursing note reviewed. Exam conducted with a chaperone present.  Constitutional:      General: He is not in acute distress.    Appearance: Normal appearance. He is obese. He is not ill-appearing.  Cardiovascular:      Rate and Rhythm: Normal rate.  Pulmonary:     Effort: Pulmonary effort is normal.  Neurological:     Mental Status: He is alert and oriented to person, place, and time.  Psychiatric:        Attention and Perception: Attention and perception normal. He does not perceive auditory or visual hallucinations.        Mood and Affect: Mood is anxious and depressed.        Speech: Speech normal.        Behavior: Behavior normal. Behavior is cooperative.        Thought Content: Thought content includes suicidal ideation. Thought content includes suicidal plan.        Cognition and Memory: Cognition and memory normal.        Judgment: Judgment is impulsive.   Review of Systems  Constitutional:  Positive for diaphoresis.  HENT: Negative.    Eyes: Negative.   Respiratory: Negative.    Cardiovascular: Negative.   Gastrointestinal: Negative.   Genitourinary: Negative.   Musculoskeletal: Negative.   Skin: Negative.   Neurological: Negative.   Endo/Heme/Allergies: Negative.   Psychiatric/Behavioral:  Positive for depression, substance abuse and suicidal ideas. Negative for hallucinations. The patient is nervous/anxious.   Blood pressure 139/90, pulse (!) 102, temperature 98.1 F (36.7 C), temperature source Oral, resp. rate 16, SpO2 99 %. There is no height or weight on file to calculate BMI.  Treatment Plan Summary: Daily contact with patient to assess and evaluate symptoms and progress in treatment, Medication management, and Plan inpatient psychiatric treatment  Disposition: Recommend psychiatric Inpatient admission when medically cleared.  This service was provided via telemedicine using a 2-way, interactive audio and video technology.  Names of all persons participating in this telemedicine service and their role in this encounter. Name: Assunta Found Role: NP  Name: Dr. Nelly Rout Role: Psychiatrist  Name: Bruce Moreno Role: Patient  Name: Jannifer Franklin, RN Role: Patient's  nurse sent a secure message informing: Psychiatric consult complete.  Patient continues to need inpatient psychiatric treatment  Patient has been accept to [facility] for tomorrow (08/25/2020) after 0800     Jenean Escandon, NP 08/24/2020 4:16 PM

## 2020-08-24 NOTE — ED Notes (Signed)
C/o headache tylenol given for same

## 2020-08-24 NOTE — Progress Notes (Signed)
Patient has been faxed out per the request of Dr. Lucianne Muss. Patient meets inpatient criteria per Assunta Found ,NP. Patient referred to the following facilities:  [facility]  [address] [place] [address] [phone]  [facility]  [address], [place] [address] [phone]  [facility]  [address] [place] [address] [phone]  [facility]  [address] [place] [address] [phone]  [facility]  [address] [place], [place] [address] [phone]  [facility]  [address] [place], [place] [address] [phone]  [facility]  [address] [place] [address] [phone]  [facility]  [address], [place] [address] [phone]  [facility]  [address] [place] [address] [phone]    CSW will continue to monitor disposition.    Damita Dunnings, MSW, LCSW-A  1:21 PM 08/24/2020

## 2020-08-24 NOTE — ED Provider Notes (Signed)
Emergency Medicine Observation Re-evaluation Note  Bruce Moreno is a 50 y.o. male, seen on rounds today.  Pt initially presented to the ED for complaints of Suicidal Currently, the patient is awaiting medical clearance.  Physical Exam  BP (!) 152/98   Pulse (!) 108   Temp 99.4 F (37.4 C) (Oral)   Resp 18   SpO2 100%  Physical Exam General: Awake, alert Neuro: No focal deficits. Lungs: Breathing even and unlabored Psych: No acute distress, no agitation.  ED Course / MDM  EKG:EKG Interpretation  Date/Time:  Tuesday August 23 2020 17:37:31 EDT Ventricular Rate:  104 PR Interval:  138 QRS Duration: 86 QT Interval:  366 QTC Calculation: 481 R Axis:   96 Text Interpretation: Sinus tachycardia Right atrial enlargement Rightward axis Pulmonary disease pattern Biventricular hypertrophy Abnormal ECG No old tracing to compare Confirmed by Alona Bene [phone]) on 08/23/2020 6:50:05 PM  I have reviewed the labs performed to date as well as medications administered while in observation.  Recent changes in the last 24 hours include patient underwent serial troponins last night.  Results were normal.  Call found to be elevated at 189.  Urine drug screen pending.  No agitation reported during shift.  Plan  Current plan is for medical clearance and disposition per psychiatry.    Bruce Moreno is not under involuntary commitment.     Gloris Manchester, MD 08/24/20 [phone]

## 2020-08-24 NOTE — ED Notes (Signed)
Ambulated to restroom at this time 

## 2020-08-24 NOTE — ED Provider Notes (Addendum)
Emergency Medicine Observation Re-evaluation Note  Bruce Moreno is a 50 y.o. male, seen on rounds today.  Pt initially presented to the ED for complaints of substance abuse and psychosocial stressors associated mood disorder w suicidal thoughts.   Physical Exam  BP (!) 152/98   Pulse (!) 108   Temp 99.4 F (37.4 C) (Oral)   Resp 18   SpO2 100%  Physical Exam General: calm, nad Cardiac: regular rate Lungs: breathing comfortably Psych: calm, nad. Pt does not appear to be responding to internal stimuli, and does not voice any thoughts of harm to self or others.   ED Course / MDM    I have reviewed the labs performed to date as well as medications administered while in observation.  Recent changes in the last 24 hours include metabolism of substances, observation and BH reassessment.   Plan  Patient ate well/completed breakfast tray. No trouble sleeping last night/this AM.    Current plan is for Bruce Moreno reassessment. ?substance abuse and social situation/homelessness induced mood disorder.  Bruce Moreno is not under involuntary commitment.   Bruce Laine, MD 08/24/20 [phone]  Patient accepted to [place] [facility], 8/4 at 8 AM, Dr Loyola Mast.     Bruce Laine, MD 08/24/20 [phone]

## 2020-08-24 NOTE — ED Notes (Signed)
Pt requesting something to help him sleep spoke with provider reference same

## 2020-08-24 NOTE — Discharge Instructions (Addendum)
Transport to [facility] in AM 8/4

## 2020-08-24 NOTE — Progress Notes (Signed)
Pt accepted to [facility]     Patient meets inpatient criteria per Assunta Found, NP  Dr.Thomas Loyola Mast is the attending provider.    Call report to [phone]  Mariane Duval, RN @ [facility] ED notified.     Pt scheduled  to arrive at [facility] tomorrow after 0800.   Damita Dunnings, MSW, LCSW-A  1:53 PM 08/24/2020

## 2020-08-25 MED ORDER — LORAZEPAM 1 MG PO TABS
2.0000 mg | ORAL_TABLET | Freq: Once | ORAL | Status: AC
  Administered 2020-08-25: 2 mg via ORAL
  Filled 2020-08-25: qty 2

## 2020-08-25 NOTE — ED Notes (Signed)
Pt ambulated to restroom at this time.

## 2020-08-25 NOTE — ED Notes (Signed)
Pt is asking again for something stronger to sleep advised would speak to doctor reference same

## 2020-08-25 NOTE — ED Notes (Signed)
Spoke with provider reference pt request for something to sleep

## 2020-08-25 NOTE — ED Notes (Signed)
Ambulated to restroom  

## 2020-08-25 NOTE — ED Provider Notes (Signed)
Patient has been accepted to [facility] for inpatient psychiatric evaluation.  Currently he is voluntary.  Vitals are normal.  He appears in no distress and has no complaints at this time.  EMTALA form filled out, transport has arrived.  Stable at time of transfer.   Rozelle Logan, DO 08/25/20 1246

## 2020-08-25 NOTE — ED Notes (Signed)
Pt leaves with [facility] to [facility]. [facility] Transport received all belongings. RN gave report to [employer] @ [facility]. Pt exits with steady gait to [employer].

## 2020-09-12 ENCOUNTER — Other Ambulatory Visit: Payer: Self-pay

## 2020-09-12 ENCOUNTER — Inpatient Hospital Stay (HOSPITAL_COMMUNITY)
Admission: EM | Admit: 2020-09-12 | Discharge: 2020-09-20 | DRG: 305 | Discharge status: Against Medical Advice | Payer: Medicaid Other | Attending: Family Medicine | Admitting: Family Medicine

## 2020-09-12 ENCOUNTER — Emergency Department (HOSPITAL_COMMUNITY): Payer: Medicaid Other

## 2020-09-12 DIAGNOSIS — E669 Obesity, unspecified: Secondary | ICD-10-CM | POA: Diagnosis not present

## 2020-09-12 DIAGNOSIS — M79602 Pain in left arm: Secondary | ICD-10-CM | POA: Diagnosis not present

## 2020-09-12 DIAGNOSIS — F17200 Nicotine dependence, unspecified, uncomplicated: Secondary | ICD-10-CM | POA: Diagnosis present

## 2020-09-12 DIAGNOSIS — F419 Anxiety disorder, unspecified: Secondary | ICD-10-CM | POA: Diagnosis present

## 2020-09-12 DIAGNOSIS — R7401 Elevation of levels of liver transaminase levels: Secondary | ICD-10-CM | POA: Diagnosis present

## 2020-09-12 DIAGNOSIS — F1020 Alcohol dependence, uncomplicated: Secondary | ICD-10-CM | POA: Diagnosis present

## 2020-09-12 DIAGNOSIS — R0789 Other chest pain: Secondary | ICD-10-CM | POA: Diagnosis present

## 2020-09-12 DIAGNOSIS — I110 Hypertensive heart disease with heart failure: Secondary | ICD-10-CM | POA: Diagnosis not present

## 2020-09-12 DIAGNOSIS — T447X6A Underdosing of beta-adrenoreceptor antagonists, initial encounter: Secondary | ICD-10-CM | POA: Diagnosis present

## 2020-09-12 DIAGNOSIS — Z5329 Procedure and treatment not carried out because of patient's decision for other reasons: Secondary | ICD-10-CM | POA: Diagnosis present

## 2020-09-12 DIAGNOSIS — E7800 Pure hypercholesterolemia, unspecified: Secondary | ICD-10-CM | POA: Diagnosis present

## 2020-09-12 DIAGNOSIS — F32A Depression, unspecified: Secondary | ICD-10-CM | POA: Diagnosis not present

## 2020-09-12 DIAGNOSIS — K5900 Constipation, unspecified: Secondary | ICD-10-CM | POA: Diagnosis present

## 2020-09-12 DIAGNOSIS — I160 Hypertensive urgency: Secondary | ICD-10-CM | POA: Diagnosis not present

## 2020-09-12 DIAGNOSIS — I82613 Acute embolism and thrombosis of superficial veins of upper extremity, bilateral: Secondary | ICD-10-CM | POA: Diagnosis not present

## 2020-09-12 DIAGNOSIS — Z6831 Body mass index (BMI) 31.0-31.9, adult: Secondary | ICD-10-CM | POA: Diagnosis not present

## 2020-09-12 DIAGNOSIS — F10239 Alcohol dependence with withdrawal, unspecified: Secondary | ICD-10-CM | POA: Diagnosis present

## 2020-09-12 DIAGNOSIS — Z20822 Contact with and (suspected) exposure to covid-19: Secondary | ICD-10-CM | POA: Diagnosis not present

## 2020-09-12 DIAGNOSIS — I5022 Chronic systolic (congestive) heart failure: Secondary | ICD-10-CM | POA: Diagnosis present

## 2020-09-12 DIAGNOSIS — R079 Chest pain, unspecified: Secondary | ICD-10-CM | POA: Diagnosis not present

## 2020-09-12 DIAGNOSIS — F1410 Cocaine abuse, uncomplicated: Secondary | ICD-10-CM | POA: Diagnosis present

## 2020-09-12 DIAGNOSIS — R609 Edema, unspecified: Secondary | ICD-10-CM | POA: Diagnosis not present

## 2020-09-12 DIAGNOSIS — R9431 Abnormal electrocardiogram [ECG] [EKG]: Secondary | ICD-10-CM | POA: Diagnosis present

## 2020-09-12 DIAGNOSIS — Z7289 Other problems related to lifestyle: Secondary | ICD-10-CM

## 2020-09-12 DIAGNOSIS — M5010 Cervical disc disorder with radiculopathy, unspecified cervical region: Secondary | ICD-10-CM | POA: Diagnosis present

## 2020-09-12 DIAGNOSIS — Z79899 Other long term (current) drug therapy: Secondary | ICD-10-CM | POA: Diagnosis not present

## 2020-09-12 DIAGNOSIS — R112 Nausea with vomiting, unspecified: Secondary | ICD-10-CM

## 2020-09-12 DIAGNOSIS — Z789 Other specified health status: Secondary | ICD-10-CM

## 2020-09-12 DIAGNOSIS — I10 Essential (primary) hypertension: Secondary | ICD-10-CM | POA: Diagnosis present

## 2020-09-12 DIAGNOSIS — I5020 Unspecified systolic (congestive) heart failure: Secondary | ICD-10-CM | POA: Diagnosis present

## 2020-09-12 LAB — CBC
HCT: 40.7 % (ref 39.0–52.0)
Hemoglobin: 13.2 g/dL (ref 13.0–17.0)
MCH: 31.1 pg (ref 26.0–34.0)
MCHC: 32.4 g/dL (ref 30.0–36.0)
MCV: 95.8 fL (ref 80.0–100.0)
Platelets: 191 10*3/uL (ref 150–400)
RBC: 4.25 MIL/uL (ref 4.22–5.81)
RDW: 13.2 % (ref 11.5–15.5)
WBC: 4.1 10*3/uL (ref 4.0–10.5)
nRBC: 0 % (ref 0.0–0.2)

## 2020-09-12 LAB — BASIC METABOLIC PANEL
Anion gap: 8 (ref 5–15)
BUN: 10 mg/dL (ref 6–20)
CO2: 26 mmol/L (ref 22–32)
Calcium: 9.4 mg/dL (ref 8.9–10.3)
Chloride: 106 mmol/L (ref 98–111)
Creatinine, Ser: 0.94 mg/dL (ref 0.61–1.24)
GFR, Estimated: >60 mL/min (ref >60)
Glucose, Bld: 110 mg/dL — ABNORMAL HIGH (ref 70–99)
Potassium: 4.1 mmol/L (ref 3.5–5.1)
Sodium: 140 mmol/L (ref 135–145)

## 2020-09-12 LAB — RAPID URINE DRUG SCREEN, HOSP PERFORMED
Amphetamines: NOT DETECTED
Barbiturates: NOT DETECTED
Benzodiazepines: NOT DETECTED
Cocaine: NOT DETECTED
Opiates: NOT DETECTED
Tetrahydrocannabinol: POSITIVE — AB

## 2020-09-12 LAB — ETHANOL: Alcohol, Ethyl (B): <10 mg/dL (ref <10)

## 2020-09-12 LAB — TROPONIN I (HIGH SENSITIVITY)
Troponin I (High Sensitivity): 6 ng/L (ref <18)
Troponin I (High Sensitivity): 6 ng/L (ref <18)

## 2020-09-12 LAB — HIV ANTIBODY (ROUTINE TESTING W REFLEX): HIV Screen 4th Generation wRfx: NONREACTIVE

## 2020-09-12 LAB — SARS CORONAVIRUS 2 (TAT 6-24 HRS): SARS Coronavirus 2: NEGATIVE

## 2020-09-12 LAB — MAGNESIUM: Magnesium: 1.9 mg/dL (ref 1.7–2.4)

## 2020-09-12 LAB — BRAIN NATRIURETIC PEPTIDE: B Natriuretic Peptide: 25.7 pg/mL (ref 0.0–100.0)

## 2020-09-12 MED ORDER — METOPROLOL SUCCINATE ER 100 MG PO TB24
100.0000 mg | ORAL_TABLET | Freq: Two times a day (BID) | ORAL | Status: DC
  Administered 2020-09-13 – 2020-09-20 (×15): 100 mg via ORAL
  Filled 2020-09-12 (×15): qty 1

## 2020-09-12 MED ORDER — LORAZEPAM 2 MG/ML IJ SOLN
0.0000 mg | Freq: Four times a day (QID) | INTRAMUSCULAR | Status: AC
  Administered 2020-09-12: 2 mg via INTRAVENOUS
  Administered 2020-09-13: 4 mg via INTRAVENOUS
  Administered 2020-09-14: 2 mg via INTRAVENOUS
  Filled 2020-09-12: qty 2
  Filled 2020-09-12 (×2): qty 1

## 2020-09-12 MED ORDER — METOPROLOL TARTRATE 25 MG PO TABS
100.0000 mg | ORAL_TABLET | Freq: Once | ORAL | Status: AC
  Administered 2020-09-12: 100 mg via ORAL
  Filled 2020-09-12: qty 4

## 2020-09-12 MED ORDER — SODIUM CHLORIDE 0.9% FLUSH
3.0000 mL | Freq: Two times a day (BID) | INTRAVENOUS | Status: DC
  Administered 2020-09-12 – 2020-09-19 (×15): 3 mL via INTRAVENOUS

## 2020-09-12 MED ORDER — SODIUM CHLORIDE 0.9% FLUSH: 3.0000 mL | INTRAVENOUS | Status: DC | PRN

## 2020-09-12 MED ORDER — SODIUM CHLORIDE 0.9 % IV SOLN
250.0000 mL | INTRAVENOUS | Status: DC | PRN
  Administered 2020-09-13 – 2020-09-15 (×4): 250 mL via INTRAVENOUS

## 2020-09-12 MED ORDER — THIAMINE HCL 100 MG PO TABS
100.0000 mg | ORAL_TABLET | Freq: Every day | ORAL | Status: DC
  Administered 2020-09-13 – 2020-09-14 (×2): 100 mg via ORAL
  Filled 2020-09-12 (×2): qty 1

## 2020-09-12 MED ORDER — ACETAMINOPHEN 650 MG RE SUPP: 650.0000 mg | Freq: Four times a day (QID) | RECTAL | Status: DC | PRN

## 2020-09-12 MED ORDER — SACUBITRIL-VALSARTAN 97-103 MG PO TABS
1.0000 | ORAL_TABLET | Freq: Two times a day (BID) | ORAL | Status: DC
  Administered 2020-09-12 – 2020-09-20 (×16): 1 via ORAL
  Filled 2020-09-12 (×16): qty 1

## 2020-09-12 MED ORDER — LORAZEPAM 2 MG/ML IJ SOLN: 0.0000 mg | Freq: Two times a day (BID) | INTRAMUSCULAR | Status: DC

## 2020-09-12 MED ORDER — LORAZEPAM 1 MG PO TABS: 0.0000 mg | ORAL_TABLET | Freq: Two times a day (BID) | ORAL | Status: DC

## 2020-09-12 MED ORDER — ATORVASTATIN CALCIUM 40 MG PO TABS
40.0000 mg | ORAL_TABLET | Freq: Every day | ORAL | Status: DC
  Administered 2020-09-12 – 2020-09-20 (×9): 40 mg via ORAL
  Filled 2020-09-12 (×9): qty 1

## 2020-09-12 MED ORDER — SPIRONOLACTONE 25 MG PO TABS
25.0000 mg | ORAL_TABLET | Freq: Every day | ORAL | Status: DC
  Administered 2020-09-12 – 2020-09-20 (×8): 25 mg via ORAL
  Filled 2020-09-12 (×10): qty 1

## 2020-09-12 MED ORDER — HYDROCODONE-ACETAMINOPHEN 5-325 MG PO TABS: 1.0000 | ORAL_TABLET | ORAL | Status: DC | PRN

## 2020-09-12 MED ORDER — LORAZEPAM 1 MG PO TABS
0.0000 mg | ORAL_TABLET | Freq: Four times a day (QID) | ORAL | Status: AC
  Administered 2020-09-13 (×2): 1 mg via ORAL
  Administered 2020-09-14: 3 mg via ORAL
  Administered 2020-09-14: 1 mg via ORAL
  Filled 2020-09-12: qty 3
  Filled 2020-09-12 (×3): qty 1

## 2020-09-12 MED ORDER — ENOXAPARIN SODIUM 40 MG/0.4ML IJ SOSY
40.0000 mg | PREFILLED_SYRINGE | INTRAMUSCULAR | Status: DC
  Administered 2020-09-12 – 2020-09-19 (×8): 40 mg via SUBCUTANEOUS
  Filled 2020-09-12 (×8): qty 0.4

## 2020-09-12 MED ORDER — ACETAMINOPHEN 325 MG PO TABS
650.0000 mg | ORAL_TABLET | Freq: Four times a day (QID) | ORAL | Status: DC | PRN
  Administered 2020-09-12 – 2020-09-20 (×9): 650 mg via ORAL
  Filled 2020-09-12 (×9): qty 2

## 2020-09-12 MED ORDER — NITROGLYCERIN 0.4 MG SL SUBL
0.4000 mg | SUBLINGUAL_TABLET | SUBLINGUAL | Status: DC | PRN
  Administered 2020-09-12: 0.4 mg via SUBLINGUAL
  Filled 2020-09-12: qty 1

## 2020-09-12 MED ORDER — EMPAGLIFLOZIN 10 MG PO TABS
10.0000 mg | ORAL_TABLET | Freq: Every day | ORAL | Status: DC
  Administered 2020-09-13 – 2020-09-20 (×8): 10 mg via ORAL
  Filled 2020-09-12 (×8): qty 1

## 2020-09-12 MED ORDER — FUROSEMIDE 20 MG PO TABS: 20.0000 mg | ORAL_TABLET | Freq: Every day | ORAL | Status: DC | PRN

## 2020-09-12 MED ORDER — THIAMINE HCL 100 MG/ML IJ SOLN
100.0000 mg | Freq: Every day | INTRAMUSCULAR | Status: DC
Start: 2020-09-12 — End: 2020-09-14
  Administered 2020-09-12: 100 mg via INTRAVENOUS
  Filled 2020-09-12: qty 2

## 2020-09-12 MED ORDER — LIDOCAINE 5 % EX PTCH
1.0000 | MEDICATED_PATCH | CUTANEOUS | Status: DC
  Administered 2020-09-12 – 2020-09-19 (×6): 1 via TRANSDERMAL
  Filled 2020-09-12 (×8): qty 1

## 2020-09-12 NOTE — ED Notes (Signed)
Gave pt metoprolol, noticed pt was shaky/tremoring. Updated ED PA as pt is a heavy drinker at baseline.

## 2020-09-12 NOTE — ED Notes (Addendum)
Provided pt with cup of water and warm blanket.

## 2020-09-12 NOTE — ED Triage Notes (Signed)
Patient arrived by [facility] after telling a counselor that he was wanting help for heavy daily ETOH use. Patient reports some cp after not taking meds for 4 days for HTN and CHF

## 2020-09-12 NOTE — ED Notes (Signed)
Patient in gown on monitor

## 2020-09-12 NOTE — ED Notes (Signed)
Pt reports he is here for chest pain, shortness of breath and help with alcohol detox. States he drinks half a gallon of liquor a day, last drink 0400 today. RN drew BNP, troponin, Theron Arista PA at bedside.

## 2020-09-12 NOTE — ED Provider Notes (Signed)
[facility] EMERGENCY DEPARTMENT Provider Note   CSN: [id number] Arrival date & time: 09/12/20  1046     History No chief complaint on file.   Bruce Moreno is a 50 y.o. male with a history of alcohol use disorder, congestive heart failure EF 25 to 30% (04/2020), hypertension, hyperlipidemia.  Patient presents to the emergency department with a chief complaint of alcohol detox and chest pain.  Patient reports that chest pain began today approximately.  0800-0900.  Chest pain has been constant since then.  Pain located to left-sided chest and radiates to left arm.  Patient describes pain as sharp.  Patient rates pain 7/10 on pain scale.  Chest pain is aggravated with exertion.  Pain was improved after receiving aspirin and 1 sublingual nitroglycerin with EMS.  Patient endorses associated shortness of breath and diaphoresis at onset of symptoms.  Patient reports that he has not taken any of his medications for the last 4 days.  Endorses daily alcohol use.  States he drinks 0.5 gallon of liquor daily.  Patient's last drink was this morning at 0400.  Patient expresses desire to stop drinking.  Patient reports in the past he has been admitted to the hospital for alcohol withdrawal.  Patient denies any seizures, tremors, suicidal ideations, auditory hallucinations, visual hallucinations, tactile hallucinations.  HPI     No past medical history on file.  Patient Active Problem List   Diagnosis Date Noted   Alcohol use disorder, severe, dependence (HCC) 08/24/2020   Alcohol-induced mood disorder with depressive symptoms (HCC) 08/24/2020   Alcohol intoxication in relapsed alcoholic (HCC) 08/24/2020    No past surgical history on file.     No family history on file.     Home Medications Prior to Admission medications   Medication Sig Start Date End Date Taking? Authorizing Provider  atorvastatin (LIPITOR) 20 MG tablet Take 40 mg by mouth daily.    [provider]  furosemide (LASIX) 20 MG tablet Take 20 mg by mouth daily as needed for edema.    [provider]  JARDIANCE 10 MG TABS tablet Take 10 mg by mouth daily.    [provider]  metoprolol succinate (TOPROL-XL) 100 MG 24 hr tablet Take 100 mg by mouth 2 (two) times daily. Take with or immediately following a meal.    [provider]  sacubitril-valsartan (ENTRESTO) 97-103 MG Take 1 tablet by mouth 2 (two) times daily.    [provider]  spironolactone (ALDACTONE) 25 MG tablet Take 25 mg by mouth daily.    [provider]    Allergies    Patient has no known allergies.  Review of Systems   Review of Systems  Constitutional:  Negative for chills and fever.  HENT:  Negative for congestion, rhinorrhea and sore throat.   Eyes:  Negative for visual disturbance.  Respiratory:  Negative for shortness of breath.   Cardiovascular:  Negative for chest pain, palpitations and leg swelling.  Gastrointestinal:  Negative for abdominal pain, nausea and vomiting.  Genitourinary:  Negative for difficulty urinating and dysuria.  Musculoskeletal:  Negative for back pain and neck pain.  Skin:  Negative for color change and rash.  Neurological:  Negative for dizziness, syncope, light-headedness and headaches.  Psychiatric/Behavioral:  Negative for confusion.    Physical Exam Updated Vital Signs BP (!) 198/107   Pulse (!) 107   Temp 98.8 F (37.1 C) (Oral)   Resp 20   SpO2 100%   Physical Exam  ED Results / Procedures / Treatments   Labs (all labs ordered are listed, but only abnormal results are displayed) Labs Reviewed  BASIC METABOLIC PANEL - Abnormal; Notable for the following components:      Result Value   Glucose, Bld 110 (*)    All other components within normal limits  SARS CORONAVIRUS 2 (TAT 6-24 HRS)  CBC  ETHANOL  BRAIN NATRIURETIC PEPTIDE  RAPID URINE DRUG SCREEN, HOSP PERFORMED  TROPONIN I (HIGH SENSITIVITY)  TROPONIN I  (HIGH SENSITIVITY)    EKG None  Radiology DG Chest 2 View  Result Date: 09/12/2020 CLINICAL DATA:  Chest pain EXAM: CHEST - 2 VIEW COMPARISON:  08/23/2020 FINDINGS: Heart size is normal. Mild tortuosity of the aorta. The lungs are clear. No effusions. No significant bone finding. IMPRESSION: No active cardiopulmonary disease. Electronically Signed   By: Paulina Fusi M.D.   On: 09/12/2020 11:42    Procedures Procedures   Medications Ordered in ED Medications - No data to display  ED Course  I have reviewed the triage vital signs and the nursing notes.  Pertinent labs & imaging results that were available during my care of the patient were reviewed by me and considered in my medical decision making (see chart for details).  Clinical Course as of 09/12/20 1624  Mon Sep 12, 2020  1558 Spoke to Dr. Sheppard Penton who agreed to see the patient for admission. [PB]    Clinical Course User Index [PB] Berneice Heinrich   MDM Rules/Calculators/A&P                           Alert 50 year old male in no acute distress, nontoxic appearing.  Presents with chief complaint of alcohol detox and chest pain.  Patient reports daily alcohol use.  0.5 gallons of liquor daily.  Patient's last drink 0 400 this morning.  Patient expresses desire to stop drinking.  Patient reports complicated withdrawal in the past.   Ethanol less than 10 CIWA protocol initiated.  CIWA score of 11.  Will obtain ACS work-up.  Patient noted to be hypertensive states he has not had his medications the last 4 days.  Will order patient's home dose of metoprolol.  Chest x-ray shows no active cardiopulmonary disease. EKG shows normal sinus rhythm, rightward axis, prolonged QT, no significant change from previous tracing.  Troponin 6 and 6 with delta of 0 Heart score of 5.  BNP within normal limits.    Due to patient's elevated heart score and reported history of complicated alcohol detox will consult hospitalist for  admission.    Final Clinical Impression(s) / ED Diagnoses Final diagnoses:  Chest pain, unspecified type  Alcohol use    Rx / DC Orders ED Discharge Orders     None        Haskel Schroeder, PA-C 09/12/20 1628    Rolan Bucco, MD 09/13/20 1056

## 2020-09-12 NOTE — ED Notes (Addendum)
Pt given sandwich and drinks. Denies needs, no distress noted. MD aware of BP, currently no orders to treat.

## 2020-09-12 NOTE — H&P (Signed)
History and Physical    Bruce Moreno NAT:[id number] DOB: 1970/09/23 DOA: 09/12/2020  PCP: Maryelizabeth Rowan, MD Consultants:  cardiologist: Dr. Julio Alm Patient coming from:  Home - lives alone   Chief Complaint: chest pain   HPI: Bruce Moreno is a 50 y.o. male with medical history significant of alcohol abuse, cocaine abuse, HTN, CHF, HLD, tobacco abuse who presents to ER with chest pain and alcohol detox. He states he went out of town this weekend and forgot his medication. He did a lot of drinking over the weekend. He was going to go to the liquor store, but his chest started hurting, so he came in to the ER. He states the chest pain was left lateral side shooting up into his left arm. Pain is sharp in nature and is constant and is rated as a 7/10. Movement makes it worse. He has associated diaphoresis at times. He tells me he stays constantly short of breath. He started back smoking this weekend. Remote history of cocaine use; however, his UDS was positive for cocaine 2 weeks ago.    He is also wanting to stop drinking. His last drink was at 4am this AM. He has been drinking for the last 5 years. He drinks 1/2 gallon or more of brown liquor. Has gone through withdrawals in the past.    ED Course: vitals: afebrile, bp: 181/112, HR: 64, RR: 17, oxygen: 98% RA Pertinent labs: troponin wnl, ethanol <10, CXR wnl, ekg NSR with rate of 93, prolonged qt. CIWA score of 11. protocol started. Called and asked to admit.   Review of Systems: As per HPI; otherwise review of systems reviewed and negative.   Ambulatory Status:  Ambulates without assistance   No past medical history on file.  No past surgical history on file.  Social History   Socioeconomic History   Marital status: Single    Spouse name: Not on file   Number of children: Not on file   Years of education: Not on file   Highest education level: Not on file  Occupational History   Not on file  Tobacco Use   Smoking status: Not  on file   Smokeless tobacco: Not on file  Substance and Sexual Activity   Alcohol use: Not on file   Drug use: Not on file   Sexual activity: Not on file  Other Topics Concern   Not on file  Social History Narrative   Not on file   Social Determinants of Health   Financial Resource Strain: Not on file  Food Insecurity: Not on file  Transportation Needs: Not on file  Physical Activity: Not on file  Stress: Not on file  Social Connections: Not on file  Intimate Partner Violence: Not on file    No Known Allergies  No family history on file.  Prior to Admission medications   Medication Sig Start Date End Date Taking? Authorizing Provider  atorvastatin (LIPITOR) 20 MG tablet Take 40 mg by mouth daily.   Yes [provider]  furosemide (LASIX) 20 MG tablet Take 20 mg by mouth daily as needed for edema.   Yes [provider]  JARDIANCE 10 MG TABS tablet Take 10 mg by mouth daily.   Yes [provider]  metoprolol succinate (TOPROL-XL) 100 MG 24 hr tablet Take 100 mg by mouth 2 (two) times daily. Take with or immediately following a meal.   Yes [provider]  sacubitril-valsartan (ENTRESTO) 97-103 MG Take 1 tablet by mouth 2 (  two) times daily.   Yes [provider]  spironolactone (ALDACTONE) 25 MG tablet Take 25 mg by mouth daily.   Yes [provider]    Physical Exam: Vitals:   09/12/20 1900 09/12/20 1930 09/12/20 2006 09/12/20 2020  BP: (!) 179/116 (!) 145/96  (!) 176/112  Pulse: 91 88  91  Resp: (!) 27 (!) 21    Temp:      TempSrc:      SpO2: 98% 97%    Weight:   87.5 kg   Height:   5\' 6"  (1.676 m)      General:  Appears calm and comfortable and is in NAD Eyes:  PERRL, EOMI, normal lids, iris ENT:  grossly normal hearing, lips & tongue, mmm; appropriate dentition Neck:  no LAD, masses or thyromegaly; no carotid bruits Cardiovascular:  RRR, no m/r/g. No LE edema.  Respiratory:   CTA bilaterally with no  wheezes/rales/rhonchi.  Normal respiratory effort. Abdomen:  soft, NT, ND, NABS Back:   normal alignment, no CVAT Skin:  no rash or induration seen on limited exam Musculoskeletal:  grossly normal tone BUE/BLE, good ROM, no bony abnormality. Reproducible pain over left lateral chest wall  Lower extremity:  Limited foot exam with no ulcerations.  2+ distal pulses. Psychiatric:  grossly normal mood and affect, speech fluent and appropriate, AOx3 Neurologic:  CN 2-12 grossly intact, moves all extremities in coordinated fashion, sensation intact    Radiological Exams on Admission: Independently reviewed - see discussion in A/P where applicable  DG Chest 2 View  Result Date: 09/12/2020 CLINICAL DATA:  Chest pain EXAM: CHEST - 2 VIEW COMPARISON:  08/23/2020 FINDINGS: Heart size is normal. Mild tortuosity of the aorta. The lungs are clear. No effusions. No significant bone finding. IMPRESSION: No active cardiopulmonary disease. Electronically Signed   By: 10/23/2020 M.D.   On: 09/12/2020 11:42    EKG: Independently reviewed.  NSR with rate 93, prolonged qt; nonspecific ST changes with no evidence of acute ischemia. Similar to previous tracings.    Labs on Admission: I have personally reviewed the available labs and imaging studies at the time of the admission.  Pertinent labs:   troponin wnl ethanol <10,  UDS: MJ   Assessment/Plan Principal Problem:   Chest pain 50 year old presenting with chest pain with known history of heart failure and cocaine abuse however appears to be more musculoskeletal in nature -EKG and troponin within normal limits -Cardiac cath negative for CAD in April 2022 recent exercise stress test July 2022 with no significant ST shirts of T wave abnormalities -Give him a lidocaine patch placed him on telemetry continue to monitor -UDS is negative for cocaine at this visit but if he continues to use he is at high risk for cocaine induced chest pain/MI  Active  Problems:   Alcohol use disorder, severe, dependence (HCC) -Patient desiring to quit alcohol and detox because he knows it is going to kill him -Half a gallon or more a day of dark liquor.  Last drink was at 4:00 this morning -CIWA was 11 in ER and he was started on CIWA protocol -We will need to watch him closely in the next 24 to 48 hours as he detoxes. Okay for floor right now.  -continue telemetry    Cocaine abuse (HCC) -Tells me he has not used in years but his UDS was +2 weeks ago -Could be source of chest pain even though it is reproducible today and he has no cocaine on  his UDS -Okay with continue beta-blocker since negative drug screen today -He should be counseled extensively on his cardiac risk of using this drug    HFrEF (heart failure with reduced ejection fraction) (HCC) -likely secondary to uncontrolled HTN -No signs of acute exacerbation on clinical exam -dry weight of 194 pounds and at 192 today.  -Has not had his medication over the weekend and I wonder about compliance on a long-term basis -Continue spironolactone, Entresto, Toprol, and Lasix as needed -Continue Jardiance -Plans for repeat echo later this summer fall after an optimized GDMT -monitor I/O     HTN (hypertension) - hypertensive during his stay in the emergency room + concern for withdrawal  -Has been noncompliant with medication -Per last cardiology note his readings were to goal -Starting back his home medication of Toprol, Entresto and Aldactone    Pure hypercholesterolemia -Continue atorvastatin 40 mg daily -LDL of 181 in June 2022 and medication was increased to 40 mg -Has follow-up appointment for repeat lipid panel with cardiology  Prolonged qt -telemetry -electrolytes -avoid qt prolonging drugs   Body mass index is 31.15 kg/m.    Level of care: Telemetry Medical DVT prophylaxis:  Lovenox  Code Status:  Full - confirmed with patient Family Communication: None present Disposition  Plan:  The patient is from: home  Anticipated d/c is to: home  Requires inpatient hospitalization and is at significant risk of worsening from alcohol withdrawal, requires constant monitoring, assessment.  Consults called: none  Admission status:  observation    Orland Mustard MD [facility]   How to contact the [facility] Attending or Consulting provider 7A - 7P or covering provider during after hours 7P -7A, for this patient?  Check the care team in [facility] and look for a) attending/consulting TRH provider listed and b) the [facility] team listed Log into [email or web address] and use [facility] universal password to access. If you do not have the password, please contact the hospital operator. Locate the [facility] provider you are looking for under [facility] and page to a number that you can be directly reached. If you still have difficulty reaching the provider, please page the [facility] (Director on Call) for the Hospitalists listed on amion for assistance.   09/12/2020, 8:49 PM

## 2020-09-13 ENCOUNTER — Encounter (HOSPITAL_COMMUNITY): Payer: Self-pay | Admitting: Family Medicine

## 2020-09-13 DIAGNOSIS — I5020 Unspecified systolic (congestive) heart failure: Secondary | ICD-10-CM

## 2020-09-13 DIAGNOSIS — F1410 Cocaine abuse, uncomplicated: Secondary | ICD-10-CM

## 2020-09-13 DIAGNOSIS — E7800 Pure hypercholesterolemia, unspecified: Secondary | ICD-10-CM

## 2020-09-13 DIAGNOSIS — R079 Chest pain, unspecified: Secondary | ICD-10-CM | POA: Diagnosis not present

## 2020-09-13 DIAGNOSIS — F1020 Alcohol dependence, uncomplicated: Secondary | ICD-10-CM | POA: Diagnosis not present

## 2020-09-13 MED ORDER — SENNOSIDES-DOCUSATE SODIUM 8.6-50 MG PO TABS: 1.0000 | ORAL_TABLET | Freq: Two times a day (BID) | ORAL | Status: DC

## 2020-09-13 MED ORDER — PROCHLORPERAZINE EDISYLATE 10 MG/2ML IJ SOLN
7.5000 mg | Freq: Four times a day (QID) | INTRAMUSCULAR | Status: DC | PRN
  Administered 2020-09-13 – 2020-09-16 (×2): 7.5 mg via INTRAVENOUS
  Filled 2020-09-13 (×2): qty 2

## 2020-09-13 MED ORDER — POLYETHYLENE GLYCOL 3350 17 G PO PACK
17.0000 g | PACK | Freq: Every day | ORAL | Status: DC
Start: 2020-09-13 — End: 2020-09-16
  Administered 2020-09-13 – 2020-09-14 (×2): 17 g via ORAL
  Filled 2020-09-13 (×4): qty 1

## 2020-09-13 MED ORDER — MAGNESIUM SULFATE 2 GM/50ML IV SOLN
2.0000 g | Freq: Once | INTRAVENOUS | Status: AC
Start: 2020-09-13 — End: 2020-09-14
  Administered 2020-09-13: 2 g via INTRAVENOUS
  Filled 2020-09-13: qty 50

## 2020-09-13 MED ORDER — MAGNESIUM SULFATE 2 GM/50ML IV SOLN: 2.0000 g | Freq: Once | INTRAVENOUS | Status: DC

## 2020-09-13 MED ORDER — CLONIDINE HCL 0.1 MG PO TABS
0.1000 mg | ORAL_TABLET | Freq: Three times a day (TID) | ORAL | Status: DC
  Administered 2020-09-13 – 2020-09-15 (×8): 0.1 mg via ORAL
  Filled 2020-09-13 (×8): qty 1

## 2020-09-13 MED ORDER — SENNOSIDES-DOCUSATE SODIUM 8.6-50 MG PO TABS
1.0000 | ORAL_TABLET | Freq: Two times a day (BID) | ORAL | Status: DC
  Administered 2020-09-14 – 2020-09-16 (×2): 1 via ORAL
  Filled 2020-09-13 (×6): qty 1

## 2020-09-13 MED ORDER — CYCLOBENZAPRINE HCL 5 MG PO TABS
7.5000 mg | ORAL_TABLET | Freq: Three times a day (TID) | ORAL | Status: DC | PRN
  Administered 2020-09-18 – 2020-09-20 (×3): 7.5 mg via ORAL
  Filled 2020-09-13 (×4): qty 1.5

## 2020-09-13 NOTE — TOC Initial Note (Signed)
Transition of Care [facility]) - Initial/Assessment Note    Patient Details  Name: Bruce Moreno MRN: [id number] Date of Birth: 07-Aug-1970  [facility] of [facility] CM/SW Contact:    Lockie Pares, RN Phone Number: 09/13/2020, 2:34 PM  Clinical Narrative:                  Patient 50 yo male admitted with chest pain, ETOH. Patient is 11 on CIWA score, drank heavily over the weekend. Will need SA/ETOH counseling. . Initial troponin  6 positive for THC, admitted to cocaine use 2 weeks ago, hypertensive in the ED. CSW /CM will follow for resources and other needs  Expected Discharge Plan: Home/Self Care Barriers to Discharge: Continued Medical Work up   Patient Goals and CMS Choice        Expected Discharge Plan and Services Expected Discharge Plan: Home/Self Care In-house Referral: Clinical Social Work Discharge Planning Services: CM Consult   Living arrangements for the past 2 months: Single Family Home                                      Prior Living Arrangements/Services Living arrangements for the past 2 months: Single Family Home   Patient language and need for interpreter reviewed:: Yes        Need for Family Participation in Patient Care: Yes (Comment) Care giver support system in place?: Yes (comment)   Criminal Activity/Legal Involvement Pertinent to Current Situation/Hospitalization: No - Comment as needed  Activities of Daily Living Home Assistive Devices/Equipment: None ADL Screening (condition at time of admission) Patient's cognitive ability adequate to safely complete daily activities?: Yes Is the patient deaf or have difficulty hearing?: No Does the patient have difficulty seeing, even when wearing glasses/contacts?: No Does the patient have difficulty concentrating, remembering, or making decisions?: No Patient able to express need for assistance with ADLs?: Yes Does the patient have difficulty dressing or bathing?: No Independently performs  ADLs?: Yes (appropriate for developmental age) Does the patient have difficulty walking or climbing stairs?: No Weakness of Legs: None Weakness of Arms/Hands: None  Permission Sought/Granted                  Emotional Assessment       Orientation: : Oriented to Self, Oriented to Place, Oriented to Situation Alcohol / Substance Use: Alcohol Use, Illicit Drugs Psych Involvement: No (comment)  Admission diagnosis:  Alcohol use [Z72.89] Chest pain [R07.9] Chest pain, unspecified type [R07.9] Patient Active Problem List   Diagnosis Date Noted   Chest pain on exertion 09/12/2020   Chest pain 09/12/2020   Alcohol use disorder, severe, dependence (HCC) 08/24/2020   Alcohol-induced mood disorder with depressive symptoms (HCC) 08/24/2020   Alcohol intoxication in relapsed alcoholic (HCC) 08/24/2020   HFrEF (heart failure with reduced ejection fraction) (HCC) 05/18/2020   Pure hypercholesterolemia 02/10/2020   HTN (hypertension) 11/23/2019   Cocaine abuse (HCC) 05/12/2018   PCP:  Maryelizabeth Rowan, MD Pharmacy:   [facility] [phone] - [place], [place] - [address] AT [facility] [address] [place] [address] Phone: [phone] Fax: [phone]     Social Determinants of Health (SDOH) Interventions    Readmission Risk Interventions No flowsheet data found.

## 2020-09-13 NOTE — Progress Notes (Signed)
PROGRESS NOTE  Jahn Franchini  YDX:[id number] DOB: Oct 20, 1970 DOA: 09/12/2020 PCP: Maryelizabeth Rowan, MD   Brief Narrative: Lenardo Westwood is a 50 y.o. male with a history of alcohol, cocaine and tobacco abuse, chronic HFrEF, HTN, HLD, and substance-induced mood disorder who presented to the ED 8/22 with severe left chest pain. ECG and cardiac enzymes were reassuring, though he was severely hypertensive and reporting desire to detox from alcohol. Started on CIWA, BP medications augmented, and he was admitted.  Assessment & Plan: Principal Problem:   Chest pain on exertion Active Problems:   Alcohol use disorder, severe, dependence (HCC)   Cocaine abuse (HCC)   HFrEF (heart failure with reduced ejection fraction) (HCC)   HTN (hypertension)   Pure hypercholesterolemia   Chest pain  Alcohol abuse and withdrawal: States he drinks brown liquor daily, usually 1/2 gallon. Last drink 4am on 8/22. Began drinking at age 60, has been off and on, heavily drinking since Feb 2022 when his nephew, whom he raised, died.  - Continue CIWA.  - TOC consulted, will need resources and psychosocial support.  Chest pain: Cardiac enzymes have been reassuring. Resolved. Treadmill stress test Jul 2022 showed no ST changes or arrhythmias. LHC and RHC April 2022 showed no significant coronary disease, normal filling pressures, and low-normal cardiac output. - Continue monitoring with cardiac medications continued as below.   Chronic combined HFrEF:  - Continue home metoprolol, entresto, spironolactone, empagliflozin. Can continue prn lasix, though does not appear volume overloaded.  - Monitor I/O, weights.  - Follow up with cardiology as outpatient.  HTN urgency: Remains severely elevated - Add clonidine as adjunct with alcohol withdrawal as well - Continue home medications as above.  HLD:  - Continue statin  Cocaine use: +UDS 3 weeks ago but only +THC at admission here.  - Cessation counseling reviewed.    QT prolongation:  - Monitor on telemetry, monitor electrolytes, avoid provocative agents.   Substance-induced mood disorder: Notes reviewed from recent hospitalization at [facility] for SI. No current complaints or reports of thoughts of self harm.  Cervical disc disease with left radiculopathy: Stable symptoms per patient.  - Trial flexeril prn - Follow up with outpatient provider at [facility] for ongoing injections.   Constipation:  - Start scheduled bowel regimen.  Obesity: Estimated body mass index is 31.67 kg/m as calculated from the following:   Height as of this encounter: 5\' 6"  (1.676 m).   Weight as of this encounter: 89 kg.  DVT prophylaxis: Lovenox Code Status: Full Family Communication: None at bedside Disposition Plan:  Status is: Inpatient  Remains inpatient appropriate because:Inpatient level of care appropriate due to severity of illness  Dispo: The patient is from: Home              Anticipated d/c is to: Home              Patient currently is not medically stable to d/c.   Difficult to place patient No  Consultants:  None  Procedures:  None  Antimicrobials: None   Subjective: No chest pain, but did have worsening left neck pain radiating to shoulder and down lateral arm to the hand which is a chronic intermittently flaring problem for him due to a bulging cervical disc for which he gets injections regularly at orthopedics in [facility]. No new characteristics from prior episodes, no other weakness or numbness, no speech difficulties. Has steady gait.   Objective: Vitals:   09/13/20 0800 09/13/20 1000 09/13/20 1129 09/13/20 1237  BP: (!) 159/115 (!) 175/110 (!) 169/102 (!) 174/112  Pulse: 89  76 92  Resp: 18  18 18   Temp: 98.3 F (36.8 C)  98.6 F (37 C)   TempSrc: Oral  Oral   SpO2: 100%  97% 96%  Weight: 89 kg     Height:        Intake/Output Summary (Last 24 hours) at 09/13/2020 1356 Last data filed at 09/13/2020 1100 Gross per 24 hour  Intake 1080 ml   Output 200 ml  Net 880 ml   Filed Weights   09/12/20 2006 09/13/20 0800  Weight: 87.5 kg 89 kg    Gen: 50 y.o. male in no distress  Pulm: Non-labored breathing room air. Clear to auscultation bilaterally.  CV: Regular rate and rhythm. No murmur, rub, or gallop. No JVD, no pedal edema. GI: Abdomen soft, non-tender, non-distended, with normoactive bowel sounds. No organomegaly or masses felt. Ext: Warm, no deformities. Paresthetic findings on lateral left arm extending to thumb and first digit without weakness.  Skin: No rashes, lesions or ulcers Neuro: Alert and oriented. No focal neurological deficits. Psych: Judgement and insight appear normal. Mood & affect appropriate.   Data Reviewed: I have personally reviewed following labs and imaging studies  CBC: Recent Labs  Lab 09/12/20 1108  WBC 4.1  HGB 13.2  HCT 40.7  MCV 95.8  PLT 191   Basic Metabolic Panel: Recent Labs  Lab 09/12/20 1108 09/12/20 2131  NA 140  --   K 4.1  --   CL 106  --   CO2 26  --   GLUCOSE 110*  --   BUN 10  --   CREATININE 0.94  --   CALCIUM 9.4  --   MG  --  1.9   GFR: Estimated Creatinine Clearance: 97.2 mL/min (by C-G formula based on SCr of 0.94 mg/dL). Liver Function Tests: No results for input(s): AST, ALT, ALKPHOS, BILITOT, PROT, ALBUMIN in the last 168 hours. No results for input(s): LIPASE, AMYLASE in the last 168 hours. No results for input(s): AMMONIA in the last 168 hours. Coagulation Profile: No results for input(s): INR, PROTIME in the last 168 hours. Cardiac Enzymes: No results for input(s): CKTOTAL, CKMB, CKMBINDEX, TROPONINI in the last 168 hours. BNP (last 3 results) No results for input(s): PROBNP in the last 8760 hours. HbA1C: No results for input(s): HGBA1C in the last 72 hours. CBG: No results for input(s): GLUCAP in the last 168 hours. Lipid Profile: No results for input(s): CHOL, HDL, LDLCALC, TRIG, CHOLHDL, LDLDIRECT in the last 72 hours. Thyroid Function  Tests: No results for input(s): TSH, T4TOTAL, FREET4, T3FREE, THYROIDAB in the last 72 hours. Anemia Panel: No results for input(s): VITAMINB12, FOLATE, FERRITIN, TIBC, IRON, RETICCTPCT in the last 72 hours. Urine analysis: No results found for: COLORURINE, APPEARANCEUR, LABSPEC, PHURINE, GLUCOSEU, HGBUR, BILIRUBINUR, KETONESUR, PROTEINUR, UROBILINOGEN, NITRITE, LEUKOCYTESUR Recent Results (from the past 240 hour(s))  SARS CORONAVIRUS 2 (TAT 6-24 HRS) Nasopharyngeal Nasopharyngeal Swab     Status: None   Collection Time: 09/12/20  3:45 PM   Specimen: Nasopharyngeal Swab  Result Value Ref Range Status   SARS Coronavirus 2 NEGATIVE NEGATIVE Final    Comment: (NOTE) SARS-CoV-2 target nucleic acids are NOT DETECTED.  The SARS-CoV-2 RNA is generally detectable in upper and lower respiratory specimens during the acute phase of infection. Negative results do not preclude SARS-CoV-2 infection, do not rule out co-infections with other pathogens, and should not be used as the sole basis for  treatment or other patient management decisions. Negative results must be combined with clinical observations, patient history, and epidemiological information. The expected result is Negative.  Fact Sheet for Patients: HairSlick.no  Fact Sheet for Healthcare Providers: quierodirigir.com  This test is not yet approved or cleared by the [place] FDA and  has been authorized for detection and/or diagnosis of SARS-CoV-2 by FDA under an Emergency Use Authorization (EUA). This EUA will remain  in effect (meaning this test can be used) for the duration of the COVID-19 declaration under Se ction 564(b)(1) of the Act, 21 U.S.C. section 360bbb-3(b)(1), unless the authorization is terminated or revoked sooner.  Performed at [facility], [address], [place], [place] [address]       Radiology Studies: DG Chest 2 View  Result Date:  09/12/2020 CLINICAL DATA:  Chest pain EXAM: CHEST - 2 VIEW COMPARISON:  08/23/2020 FINDINGS: Heart size is normal. Mild tortuosity of the aorta. The lungs are clear. No effusions. No significant bone finding. IMPRESSION: No active cardiopulmonary disease. Electronically Signed   By: Paulina Fusi M.D.   On: 09/12/2020 11:42    Scheduled Meds:  atorvastatin  40 mg Oral Daily   cloNIDine  0.1 mg Oral TID   empagliflozin  10 mg Oral Daily   enoxaparin (LOVENOX) injection  40 mg Subcutaneous Q24H   lidocaine  1 patch Transdermal Q24H   LORazepam  0-4 mg Intravenous Q6H   Or   LORazepam  0-4 mg Oral Q6H   [START ON 09/14/2020] LORazepam  0-4 mg Intravenous Q12H   Or   [START ON 09/14/2020] LORazepam  0-4 mg Oral Q12H   metoprolol succinate  100 mg Oral BID   polyethylene glycol  17 g Oral Daily   sacubitril-valsartan  1 tablet Oral BID   senna-docusate  1 tablet Oral BID   sodium chloride flush  3 mL Intravenous Q12H   spironolactone  25 mg Oral Daily   thiamine  100 mg Oral Daily   Or   thiamine  100 mg Intravenous Daily   Continuous Infusions:  sodium chloride       LOS: 1 day   Time spent: 35 minutes.  Tyrone Nine, MD [facility] [email or web address] 09/13/2020, 1:56 PM

## 2020-09-13 NOTE — Progress Notes (Signed)
[facility] night shift telemetry coverage note.  The patient told the staff that he was having nausea and requested something to alleviate it.  His QT was prolonged on yesterday's EKG likely due to overall depletion of magnesium in the setting of alcoholism.  He was admitted with chest pain and EtOH withdrawal.  Compazine 7.5 mg every 6 hours as needed ordered.  Magnesium sulfate 2 g IVPB x1 dose given for a prolonged QT interval and EtOH withdrawal  Sanda Klein, MD.

## 2020-09-14 DIAGNOSIS — R079 Chest pain, unspecified: Secondary | ICD-10-CM | POA: Diagnosis not present

## 2020-09-14 LAB — CBC
HCT: 42.1 % (ref 39.0–52.0)
Hemoglobin: 14.4 g/dL (ref 13.0–17.0)
MCH: 31.4 pg (ref 26.0–34.0)
MCHC: 34.2 g/dL (ref 30.0–36.0)
MCV: 91.7 fL (ref 80.0–100.0)
Platelets: 221 10*3/uL (ref 150–400)
RBC: 4.59 MIL/uL (ref 4.22–5.81)
RDW: 12.8 % (ref 11.5–15.5)
WBC: 6.9 10*3/uL (ref 4.0–10.5)
nRBC: 0 % (ref 0.0–0.2)

## 2020-09-14 LAB — COMPREHENSIVE METABOLIC PANEL
ALT: 33 U/L (ref 0–44)
ALT: 37 U/L (ref 0–44)
AST: 22 U/L (ref 15–41)
AST: 27 U/L (ref 15–41)
Albumin: 3.5 g/dL (ref 3.5–5.0)
Albumin: 3.6 g/dL (ref 3.5–5.0)
Alkaline Phosphatase: 71 U/L (ref 38–126)
Alkaline Phosphatase: 74 U/L (ref 38–126)
Anion gap: 8 (ref 5–15)
Anion gap: 8 (ref 5–15)
BUN: 8 mg/dL (ref 6–20)
BUN: 12 mg/dL (ref 6–20)
CO2: 24 mmol/L (ref 22–32)
CO2: 24 mmol/L (ref 22–32)
Calcium: 9.6 mg/dL (ref 8.9–10.3)
Calcium: 9.5 mg/dL (ref 8.9–10.3)
Chloride: 103 mmol/L (ref 98–111)
Chloride: 104 mmol/L (ref 98–111)
Creatinine, Ser: 0.96 mg/dL (ref 0.61–1.24)
Creatinine, Ser: 1.14 mg/dL (ref 0.61–1.24)
GFR, Estimated: >60 mL/min (ref >60)
GFR, Estimated: >60 mL/min (ref >60)
Glucose, Bld: 102 mg/dL — ABNORMAL HIGH (ref 70–99)
Glucose, Bld: 129 mg/dL — ABNORMAL HIGH (ref 70–99)
Potassium: 4.3 mmol/L (ref 3.5–5.1)
Potassium: 4.2 mmol/L (ref 3.5–5.1)
Sodium: 135 mmol/L (ref 135–145)
Sodium: 136 mmol/L (ref 135–145)
Total Bilirubin: 0.5 mg/dL (ref 0.3–1.2)
Total Bilirubin: 0.5 mg/dL (ref 0.3–1.2)
Total Protein: 7.4 g/dL (ref 6.5–8.1)
Total Protein: 7.5 g/dL (ref 6.5–8.1)

## 2020-09-14 LAB — PHOSPHORUS: Phosphorus: 4.1 mg/dL (ref 2.5–4.6)

## 2020-09-14 LAB — MAGNESIUM: Magnesium: 2.2 mg/dL (ref 1.7–2.4)

## 2020-09-14 MED ORDER — LORAZEPAM 2 MG/ML IJ SOLN
0.0000 mg | Freq: Four times a day (QID) | INTRAMUSCULAR | Status: AC
Start: 2020-09-14 — End: 2020-09-16
  Administered 2020-09-14 – 2020-09-16 (×4): 2 mg via INTRAVENOUS
  Administered 2020-09-16: 1 mg via INTRAVENOUS
  Filled 2020-09-14 (×5): qty 1

## 2020-09-14 MED ORDER — THIAMINE HCL 100 MG/ML IJ SOLN
250.0000 mg | Freq: Every day | INTRAVENOUS | Status: DC
  Administered 2020-09-18 – 2020-09-20 (×3): 250 mg via INTRAVENOUS
  Filled 2020-09-14 (×3): qty 2.5

## 2020-09-14 MED ORDER — CHLORDIAZEPOXIDE HCL 25 MG PO CAPS
25.0000 mg | ORAL_CAPSULE | Freq: Every day | ORAL | Status: AC
  Administered 2020-09-17 – 2020-09-18 (×2): 25 mg via ORAL
  Filled 2020-09-14 (×2): qty 1

## 2020-09-14 MED ORDER — THIAMINE HCL 100 MG/ML IJ SOLN
500.0000 mg | Freq: Three times a day (TID) | INTRAVENOUS | Status: AC
  Administered 2020-09-14 – 2020-09-17 (×8): 500 mg via INTRAVENOUS
  Filled 2020-09-14 (×11): qty 5

## 2020-09-14 MED ORDER — LORAZEPAM 2 MG/ML IJ SOLN
0.0000 mg | Freq: Two times a day (BID) | INTRAMUSCULAR | Status: AC
  Administered 2020-09-16 – 2020-09-17 (×2): 2 mg via INTRAVENOUS
  Administered 2020-09-17: 3 mg via INTRAVENOUS
  Administered 2020-09-18: 1 mg via INTRAVENOUS
  Filled 2020-09-14 (×3): qty 1
  Filled 2020-09-14: qty 2

## 2020-09-14 MED ORDER — CHLORDIAZEPOXIDE HCL 25 MG PO CAPS
25.0000 mg | ORAL_CAPSULE | Freq: Two times a day (BID) | ORAL | Status: AC
  Administered 2020-09-15 – 2020-09-17 (×4): 25 mg via ORAL
  Filled 2020-09-14 (×4): qty 1

## 2020-09-14 MED ORDER — LORAZEPAM 1 MG PO TABS
1.0000 mg | ORAL_TABLET | ORAL | Status: AC | PRN
  Filled 2020-09-14: qty 1

## 2020-09-14 MED ORDER — CHLORDIAZEPOXIDE HCL 25 MG PO CAPS
25.0000 mg | ORAL_CAPSULE | Freq: Four times a day (QID) | ORAL | Status: AC
  Administered 2020-09-14 – 2020-09-15 (×4): 25 mg via ORAL
  Filled 2020-09-14 (×4): qty 1

## 2020-09-14 MED ORDER — LORAZEPAM 2 MG/ML IJ SOLN
1.0000 mg | INTRAMUSCULAR | Status: AC | PRN
  Administered 2020-09-16: 2 mg via INTRAVENOUS
  Administered 2020-09-17: 3 mg via INTRAVENOUS
  Filled 2020-09-14: qty 2
  Filled 2020-09-14: qty 1

## 2020-09-14 MED ORDER — THIAMINE HCL 100 MG PO TABS: 100.0000 mg | ORAL_TABLET | Freq: Every day | ORAL | Status: DC

## 2020-09-14 NOTE — Progress Notes (Signed)
PROGRESS NOTE    Bruce Moreno  NWG:[id number] DOB: [birth year] DOA: 09/12/2020 PCP: Maryelizabeth Rowan, MD   No chief complaint on file.  Brief Narrative:  Bruce Moreno is Bruce Moreno 50 y.o. male with Bruce Moreno history of alcohol, cocaine and tobacco abuse, chronic HFrEF, HTN, HLD, and substance-induced mood disorder who presented to the ED 8/22 with severe left chest pain. ECG and cardiac enzymes were reassuring, though he was severely hypertensive and reporting desire to detox from alcohol. Started on CIWA, BP medications augmented, and he was admitted.  Assessment & Plan:   Principal Problem:   Chest pain on exertion Active Problems:   Alcohol use disorder, severe, dependence (HCC)   Cocaine abuse (HCC)   HFrEF (heart failure with reduced ejection fraction) (HCC)   HTN (hypertension)   Pure hypercholesterolemia   Chest pain  Alcohol abuse and withdrawal: States he drinks brown liquor daily, usually 1/2 gallon. Last drink 4am on 8/22. Began drinking at age 33, has been off and on, heavily drinking since Feb 2022 when his nephew, whom he raised, died.  - Continue CIWA.  He's high risk for complicated withdrawal - add librium taper. - TOC consulted, will need resources and psychosocial support.   Chest pain: Cardiac enzymes have been reassuring. Resolved. Treadmill stress test Jul 2022 showed no ST changes or arrhythmias. LHC and RHC April 2022 showed no significant coronary disease, normal filling pressures, and low-normal cardiac output. - Continue monitoring with cardiac medications continued as below.    Chronic combined HFrEF:  - Continue home metoprolol, entresto, spironolactone, empagliflozin. Can continue prn lasix, though does not appear volume overloaded.  - Monitor I/O, weights.  - Follow up with cardiology as outpatient.   HTN urgency: Remains severely elevated - Add clonidine as adjunct with alcohol withdrawal as well - BP improved, follow  - Continue home medications as above.    HLD:  - Continue statin   Cocaine use: +UDS 3 weeks ago but only +THC at admission here.  - Cessation counseling reviewed.    QT prolongation:  - Monitor on telemetry, monitor electrolytes, avoid provocative agents.  - repeat   Substance-induced mood disorder: Notes reviewed from recent hospitalization at [facility] for SI. No current complaints or reports of thoughts of self harm.   Cervical disc disease with left radiculopathy: Stable symptoms per patient.  - Trial flexeril prn - Follow up with outpatient provider at [facility] for ongoing injections.    Constipation:  - Start scheduled bowel regimen.   Obesity: Estimated body mass index is 31.67 kg/m as calculated from the following:  DVT prophylaxis: lovenox Code Status: full  Family Communication: none at bedside Disposition:   Status is: Inpatient  Remains inpatient appropriate because:Inpatient level of care appropriate due to severity of illness  Dispo: The patient is from: Home              Anticipated d/c is to: Home              Patient currently is not medically stable to d/c.   Difficult to place patient No       Consultants:  none  Procedures:  none  Antimicrobials:  Anti-infectives (From admission, onward)    None          Subjective: Asking for regular diet Asking for help with withdrawal symptoms  Objective: Vitals:   09/14/20 0436 09/14/20 0902 09/14/20 1300 09/14/20 1345  BP: 106/84 (!) 144/90 115/69 118/78  Pulse: 76 78 84 88  Resp:  19  20   Temp: 98.2 F (36.8 C)  98 F (36.7 C)   TempSrc: Oral  Oral   SpO2: 100%  96%   Weight: 89.2 kg     Height:        Intake/Output Summary (Last 24 hours) at 09/14/2020 1917 Last data filed at 09/14/2020 0900 Gross per 24 hour  Intake 299.08 ml  Output 301 ml  Net -1.92 ml   Filed Weights   09/12/20 2006 09/13/20 0800 09/14/20 0436  Weight: 87.5 kg 89 kg 89.2 kg    Examination:  General exam: Appears calm and comfortable  Respiratory  system: Clear to auscultation. Respiratory effort normal. Cardiovascular system: S1 & S2 heard, RRR.  Gastrointestinal system: Abdomen is nondistended, soft and nontender.  Central nervous system: Alert and oriented. No focal neurological deficits. Extremities: no LEE Skin: No rashes, lesions or ulcers Psychiatry: Judgement and insight appear normal. Mood & affect appropriate.     Data Reviewed: I have personally reviewed following labs and imaging studies  CBC: Recent Labs  Lab 09/12/20 1108  WBC 4.1  HGB 13.2  HCT 40.7  MCV 95.8  PLT 191    Basic Metabolic Panel: Recent Labs  Lab 09/12/20 1108 09/12/20 2131 09/14/20 0205  NA 140  --  135  K 4.1  --  4.3  CL 106  --  103  CO2 26  --  24  GLUCOSE 110*  --  102*  BUN 10  --  8  CREATININE 0.94  --  0.96  CALCIUM 9.4  --  9.6  MG  --  1.9  --     GFR: Estimated Creatinine Clearance: 95.3 mL/min (by C-G formula based on SCr of 0.96 mg/dL).  Liver Function Tests: Recent Labs  Lab 09/14/20 0205  AST 22  ALT 33  ALKPHOS 71  BILITOT 0.5  PROT 7.4  ALBUMIN 3.5    CBG: No results for input(s): GLUCAP in the last 168 hours.   Recent Results (from the past 240 hour(s))  SARS CORONAVIRUS 2 (TAT 6-24 HRS) Nasopharyngeal Nasopharyngeal Swab     Status: None   Collection Time: 09/12/20  3:45 PM   Specimen: Nasopharyngeal Swab  Result Value Ref Range Status   SARS Coronavirus 2 NEGATIVE NEGATIVE Final    Comment: (NOTE) SARS-CoV-2 target nucleic acids are NOT DETECTED.  The SARS-CoV-2 RNA is generally detectable in upper and lower respiratory specimens during the acute phase of infection. Negative results do not preclude SARS-CoV-2 infection, do not rule out co-infections with other pathogens, and should not be used as the sole basis for treatment or other patient management decisions. Negative results must be combined with clinical observations, patient history, and epidemiological information. The  expected result is Negative.  Fact Sheet for Patients: HairSlick.no  Fact Sheet for Healthcare Providers: quierodirigir.com  This test is not yet approved or cleared by the [place] FDA and  has been authorized for detection and/or diagnosis of SARS-CoV-2 by FDA under an Emergency Use Authorization (EUA). This EUA will remain  in effect (meaning this test can be used) for the duration of the COVID-19 declaration under Se ction 564(b)(1) of the Act, 21 U.S.C. section 360bbb-3(b)(1), unless the authorization is terminated or revoked sooner.  Performed at [facility], [address], [place], [place] [address]          Radiology Studies: No results found.      Scheduled Meds:  atorvastatin  40 mg Oral Daily  chlordiazePOXIDE  25 mg Oral QID   Followed by   Melene Muller ON 09/15/2020] chlordiazePOXIDE  25 mg Oral BID   Followed by   Melene Muller ON 09/17/2020] chlordiazePOXIDE  25 mg Oral QHS   cloNIDine  0.1 mg Oral TID   empagliflozin  10 mg Oral Daily   enoxaparin (LOVENOX) injection  40 mg Subcutaneous Q24H   lidocaine  1 patch Transdermal Q24H   LORazepam  0-4 mg Intravenous Q6H   Followed by   Melene Muller ON 09/16/2020] LORazepam  0-4 mg Intravenous Q12H   metoprolol succinate  100 mg Oral BID   polyethylene glycol  17 g Oral Daily   sacubitril-valsartan  1 tablet Oral BID   senna-docusate  1 tablet Oral BID   sodium chloride flush  3 mL Intravenous Q12H   spironolactone  25 mg Oral Daily   [START ON 09/23/2020] thiamine  100 mg Oral Daily   Continuous Infusions:  sodium chloride Stopped (09/13/20 2250)   thiamine injection     Followed by   Melene Muller ON 09/18/2020] thiamine injection       LOS: 2 days    Time spent: over 30 min    Lacretia Nicks, MD [facility]   To contact the attending provider between 7A-7P or the covering provider during after hours 7P-7A, please log into the web site  [email or web address] and access using universal [place] password for that web site. If you do not have the password, please call the hospital operator.  09/14/2020, 7:17 PM

## 2020-09-15 ENCOUNTER — Encounter (HOSPITAL_COMMUNITY): Payer: Self-pay | Admitting: Family Medicine

## 2020-09-15 DIAGNOSIS — R079 Chest pain, unspecified: Secondary | ICD-10-CM | POA: Diagnosis not present

## 2020-09-15 LAB — COMPREHENSIVE METABOLIC PANEL
ALT: 36 U/L (ref 0–44)
AST: 25 U/L (ref 15–41)
Albumin: 3.4 g/dL — ABNORMAL LOW (ref 3.5–5.0)
Alkaline Phosphatase: 64 U/L (ref 38–126)
Anion gap: 6 (ref 5–15)
BUN: 12 mg/dL (ref 6–20)
CO2: 25 mmol/L (ref 22–32)
Calcium: 9.5 mg/dL (ref 8.9–10.3)
Chloride: 104 mmol/L (ref 98–111)
Creatinine, Ser: 1 mg/dL (ref 0.61–1.24)
GFR, Estimated: >60 mL/min (ref >60)
Glucose, Bld: 125 mg/dL — ABNORMAL HIGH (ref 70–99)
Potassium: 3.8 mmol/L (ref 3.5–5.1)
Sodium: 135 mmol/L (ref 135–145)
Total Bilirubin: 0.5 mg/dL (ref 0.3–1.2)
Total Protein: 7 g/dL (ref 6.5–8.1)

## 2020-09-15 LAB — CBC WITH DIFFERENTIAL/PLATELET
Abs Immature Granulocytes: 0.02 10*3/uL (ref 0.00–0.07)
Basophils Absolute: 0 10*3/uL (ref 0.0–0.1)
Basophils Relative: 0 %
Eosinophils Absolute: 0.1 10*3/uL (ref 0.0–0.5)
Eosinophils Relative: 1 %
HCT: 38.8 % — ABNORMAL LOW (ref 39.0–52.0)
Hemoglobin: 13.1 g/dL (ref 13.0–17.0)
Immature Granulocytes: 0 %
Lymphocytes Relative: 33 %
Lymphs Abs: 2.2 10*3/uL (ref 0.7–4.0)
MCH: 31.4 pg (ref 26.0–34.0)
MCHC: 33.8 g/dL (ref 30.0–36.0)
MCV: 93 fL (ref 80.0–100.0)
Monocytes Absolute: 0.5 10*3/uL (ref 0.1–1.0)
Monocytes Relative: 7 %
Neutro Abs: 3.8 10*3/uL (ref 1.7–7.7)
Neutrophils Relative %: 59 %
Platelets: 215 10*3/uL (ref 150–400)
RBC: 4.17 MIL/uL — ABNORMAL LOW (ref 4.22–5.81)
RDW: 13 % (ref 11.5–15.5)
WBC: 6.5 10*3/uL (ref 4.0–10.5)
nRBC: 0 % (ref 0.0–0.2)

## 2020-09-15 LAB — PHOSPHORUS: Phosphorus: 4.3 mg/dL (ref 2.5–4.6)

## 2020-09-15 LAB — MAGNESIUM: Magnesium: 2.1 mg/dL (ref 1.7–2.4)

## 2020-09-15 MED ORDER — PANTOPRAZOLE SODIUM 40 MG PO TBEC
40.0000 mg | DELAYED_RELEASE_TABLET | Freq: Every day | ORAL | Status: DC
  Administered 2020-09-15 – 2020-09-20 (×6): 40 mg via ORAL
  Filled 2020-09-15 (×6): qty 1

## 2020-09-15 MED ORDER — CLONIDINE HCL 0.1 MG PO TABS
0.1000 mg | ORAL_TABLET | Freq: Two times a day (BID) | ORAL | Status: DC
  Administered 2020-09-15 – 2020-09-16 (×2): 0.1 mg via ORAL
  Filled 2020-09-15 (×2): qty 1

## 2020-09-15 NOTE — Progress Notes (Addendum)
PROGRESS NOTE    Bruce Moreno  CVE:[id number] DOB: October 25, 1970 DOA: 09/12/2020 PCP: Maryelizabeth Rowan, MD   No chief complaint on file.  Brief Narrative:  Bruce Moreno is Bruce Moreno 50 y.o. male with Bruce Moreno history of alcohol, cocaine and tobacco abuse, chronic HFrEF, HTN, HLD, and substance-induced mood disorder who presented to the ED 8/22 with severe left chest pain. ECG and cardiac enzymes were reassuring, though he was severely hypertensive and reporting desire to detox from alcohol. Started on CIWA, BP medications augmented, and he was admitted.  Assessment & Plan:   Principal Problem:   Chest pain on exertion Active Problems:   Alcohol use disorder, severe, dependence (HCC)   Cocaine abuse (HCC)   HFrEF (heart failure with reduced ejection fraction) (HCC)   HTN (hypertension)   Pure hypercholesterolemia   Chest pain  Alcohol abuse and withdrawal: States he drinks brown liquor daily, usually 1/2 gallon. Last drink 4am on 8/22. Began drinking at age 53, has been off and on, heavily drinking since Feb 2022 when his nephew, whom he raised, died.  - Continue CIWA - high teens yesterday, single digits this AM.  He's high risk for complicated withdrawal - continue librium taper. - TOC consulted, will need resources and psychosocial support.   Chest pain: Cardiac enzymes have been reassuring. Resolved. Treadmill stress test Jul 2022 showed no ST changes or arrhythmias. LHC and RHC April 2022 showed no significant coronary disease, normal filling pressures, and low-normal cardiac output. - Continue monitoring with cardiac medications continued as below.    Chronic combined HFrEF:  - Continue home metoprolol, entresto, spironolactone, empagliflozin. Can continue prn lasix, though does not appear volume overloaded.  - Monitor I/O, weights.  - Follow up with cardiology as outpatient.   HTN urgency: Remains severely elevated - Add clonidine as adjunct with alcohol withdrawal as well - BP  improved, follow - try to taper off clonidine - Continue home medications as above.   HLD:  - Continue statin   Cocaine use: +UDS 3 weeks ago but only +THC at admission here.  - Cessation counseling reviewed.    QT prolongation:  - Monitor on telemetry, monitor electrolytes, avoid provocative agents.  - repeat   Substance-induced mood disorder: Notes reviewed from recent hospitalization at [facility] for SI. No current complaints or reports of thoughts of self harm.   Cervical disc disease with left radiculopathy: Stable symptoms per patient.  - Trial flexeril prn - Follow up with outpatient provider at [facility] for ongoing injections.    Constipation:  - Start scheduled bowel regimen.   Obesity: Estimated body mass index is 31.67 kg/m as calculated from the following:  DVT prophylaxis: lovenox Code Status: full  Family Communication: none at bedside Disposition:   Status is: Inpatient  Remains inpatient appropriate because:Inpatient level of care appropriate due to severity of illness  Dispo: The patient is from: Home              Anticipated d/c is to: Home              Patient currently is not medically stable to d/c.   Difficult to place patient No       Consultants:  none  Procedures:  none  Antimicrobials:  Anti-infectives (From admission, onward)    None          Subjective: Feels withdrawal is better  Objective: Vitals:   09/15/20 0752 09/15/20 1314 09/15/20 1426 09/15/20 1551  BP: 116/88 104/66 113/76 120/84  Pulse:  86 83 86 81  Resp: 18   18  Temp:  98.2 F (36.8 C)    TempSrc:  Oral    SpO2:      Weight:      Height:        Intake/Output Summary (Last 24 hours) at 09/15/2020 1628 Last data filed at 09/15/2020 1549 Gross per 24 hour  Intake 181.7 ml  Output 350 ml  Net -168.3 ml   Filed Weights   09/13/20 0800 09/14/20 0436 09/15/20 0435  Weight: 89 kg 89.2 kg 87.4 kg    Examination:  General: No acute distress. Cardiovascular:  Heart sounds show Bruce Moreno regular rate, and rhythm.  Lungs: Clear to auscultation bilaterally  Abdomen: Soft, nontender, nondistended  Neurological: Alert and oriented 3. Moves all extremities 4. Cranial nerves II through XII grossly intact. Skin: Warm and dry. No rashes or lesions. Extremities: No clubbing or cyanosis. No edema.    Data Reviewed: I have personally reviewed following labs and imaging studies  CBC: Recent Labs  Lab 09/12/20 1108 09/14/20 1924 09/15/20 0308  WBC 4.1 6.9 6.5  NEUTROABS  --   --  3.8  HGB 13.2 14.4 13.1  HCT 40.7 42.1 38.8*  MCV 95.8 91.7 93.0  PLT 191 221 215    Basic Metabolic Panel: Recent Labs  Lab 09/12/20 1108 09/12/20 2131 09/14/20 0205 09/14/20 1924 09/15/20 0308  NA 140  --  135 136 135  K 4.1  --  4.3 4.2 3.8  CL 106  --  103 104 104  CO2 26  --  24 24 25   GLUCOSE 110*  --  102* 129* 125*  BUN 10  --  8 12 12   CREATININE 0.94  --  0.96 1.14 1.00  CALCIUM 9.4  --  9.6 9.5 9.5  MG  --  1.9  --  2.2 2.1  PHOS  --   --   --  4.1 4.3    GFR: Estimated Creatinine Clearance: 90.5 mL/min (by C-G formula based on SCr of 1 mg/dL).  Liver Function Tests: Recent Labs  Lab 09/14/20 0205 09/14/20 1924 09/15/20 0308  AST 22 27 25   ALT 33 37 36  ALKPHOS 71 74 64  BILITOT 0.5 0.5 0.5  PROT 7.4 7.5 7.0  ALBUMIN 3.5 3.6 3.4*    CBG: No results for input(s): GLUCAP in the last 168 hours.   Recent Results (from the past 240 hour(s))  SARS CORONAVIRUS 2 (TAT 6-24 HRS) Nasopharyngeal Nasopharyngeal Swab     Status: None   Collection Time: 09/12/20  3:45 PM   Specimen: Nasopharyngeal Swab  Result Value Ref Range Status   SARS Coronavirus 2 NEGATIVE NEGATIVE Final    Comment: (NOTE) SARS-CoV-2 target nucleic acids are NOT DETECTED.  The SARS-CoV-2 RNA is generally detectable in upper and lower respiratory specimens during the acute phase of infection. Negative results do not preclude SARS-CoV-2 infection, do not rule  out co-infections with other pathogens, and should not be used as the sole basis for treatment or other patient management decisions. Negative results must be combined with clinical observations, patient history, and epidemiological information. The expected result is Negative.  Fact Sheet for Patients: 09/17/20  Fact Sheet for Healthcare Providers:  This test is not yet approved or cleared by the 09/14/20 FDA and  has been authorized for detection and/or diagnosis of SARS-CoV-2 by FDA under an Emergency Use Authorization (EUA). This EUA will remain  in effect (meaning this test can  be used) for the duration of the COVID-19 declaration under Se ction 564(b)(1) of the Act, 21 U.S.C. section 360bbb-3(b)(1), unless the authorization is terminated or revoked sooner.  Performed at [facility], [address], [place], [place] [address]          Radiology Studies: No results found.      Scheduled Meds:  atorvastatin  40 mg Oral Daily   chlordiazePOXIDE  25 mg Oral QID   Followed by   chlordiazePOXIDE  25 mg Oral BID   Followed by   Melene Muller ON 09/17/2020] chlordiazePOXIDE  25 mg Oral QHS   cloNIDine  0.1 mg Oral TID   empagliflozin  10 mg Oral Daily   enoxaparin (LOVENOX) injection  40 mg Subcutaneous Q24H   lidocaine  1 patch Transdermal Q24H   LORazepam  0-4 mg Intravenous Q6H   Followed by   Melene Muller ON 09/16/2020] LORazepam  0-4 mg Intravenous Q12H   metoprolol succinate  100 mg Oral BID   pantoprazole  40 mg Oral Daily   polyethylene glycol  17 g Oral Daily   sacubitril-valsartan  1 tablet Oral BID   senna-docusate  1 tablet Oral BID   sodium chloride flush  3 mL Intravenous Q12H   spironolactone  25 mg Oral Daily   [START ON 09/23/2020] thiamine  100 mg Oral Daily   Continuous Infusions:  sodium chloride Stopped (09/15/20 1548)   thiamine injection Stopped (09/15/20 1449)    Followed by   Melene Muller ON 09/18/2020] thiamine injection       LOS: 3 days    Time spent: over 30 min    Lacretia Nicks, MD [facility]   To contact the attending provider between 7A-7P or the covering provider during after hours 7P-7A, please log into the web site [email or web address] and access using universal [place] password for that web site. If you do not have the password, please call the hospital operator.  09/15/2020, 4:28 PM

## 2020-09-15 NOTE — Progress Notes (Signed)
Mobility Specialist: Progress Note   09/15/20 1724  Mobility  Activity Ambulated in hall  Level of Assistance Independent  Assistive Device None  Distance Ambulated (ft) 470 ft  Mobility Ambulated independently in hallway  Mobility Response Tolerated well  Mobility performed by Mobility specialist  $Mobility charge 1 Mobility   Pre-Mobility: 89 HR During Mobility: 103 HR, 100% SpO2 Post-Mobility: 89 HR, 106/74 BP, 99% SpO2  Pt independent to stand as well as during ambulation. Pt c/o feeling dizzy, light headed, and SOB during ambulation, VSS. Pt to recliner after walk per request.   Cristal Deer Devaun Hernandez Mobility Specialist Mobility Specialist Phone: [phone]

## 2020-09-16 ENCOUNTER — Inpatient Hospital Stay (HOSPITAL_COMMUNITY): Payer: Medicaid Other

## 2020-09-16 DIAGNOSIS — M79602 Pain in left arm: Secondary | ICD-10-CM | POA: Diagnosis not present

## 2020-09-16 DIAGNOSIS — R079 Chest pain, unspecified: Secondary | ICD-10-CM | POA: Diagnosis not present

## 2020-09-16 LAB — COMPREHENSIVE METABOLIC PANEL
ALT: 47 U/L — ABNORMAL HIGH (ref 0–44)
AST: 32 U/L (ref 15–41)
Albumin: 3.5 g/dL (ref 3.5–5.0)
Alkaline Phosphatase: 68 U/L (ref 38–126)
Anion gap: 6 (ref 5–15)
BUN: 12 mg/dL (ref 6–20)
CO2: 24 mmol/L (ref 22–32)
Calcium: 9.4 mg/dL (ref 8.9–10.3)
Chloride: 106 mmol/L (ref 98–111)
Creatinine, Ser: 1.13 mg/dL (ref 0.61–1.24)
GFR, Estimated: >60 mL/min (ref >60)
Glucose, Bld: 108 mg/dL — ABNORMAL HIGH (ref 70–99)
Potassium: 3.9 mmol/L (ref 3.5–5.1)
Sodium: 136 mmol/L (ref 135–145)
Total Bilirubin: 0.6 mg/dL (ref 0.3–1.2)
Total Protein: 7.1 g/dL (ref 6.5–8.1)

## 2020-09-16 LAB — CBC WITH DIFFERENTIAL/PLATELET
Abs Immature Granulocytes: 0.02 10*3/uL (ref 0.00–0.07)
Basophils Absolute: 0 10*3/uL (ref 0.0–0.1)
Basophils Relative: 1 %
Eosinophils Absolute: 0.1 10*3/uL (ref 0.0–0.5)
Eosinophils Relative: 1 %
HCT: 39.4 % (ref 39.0–52.0)
Hemoglobin: 13.3 g/dL (ref 13.0–17.0)
Immature Granulocytes: 0 %
Lymphocytes Relative: 39 %
Lymphs Abs: 2.6 10*3/uL (ref 0.7–4.0)
MCH: 31.4 pg (ref 26.0–34.0)
MCHC: 33.8 g/dL (ref 30.0–36.0)
MCV: 93.1 fL (ref 80.0–100.0)
Monocytes Absolute: 0.4 10*3/uL (ref 0.1–1.0)
Monocytes Relative: 7 %
Neutro Abs: 3.4 10*3/uL (ref 1.7–7.7)
Neutrophils Relative %: 52 %
Platelets: 203 10*3/uL (ref 150–400)
RBC: 4.23 MIL/uL (ref 4.22–5.81)
RDW: 13.2 % (ref 11.5–15.5)
WBC: 6.6 10*3/uL (ref 4.0–10.5)
nRBC: 0 % (ref 0.0–0.2)

## 2020-09-16 LAB — LIPASE, BLOOD: Lipase: 27 U/L (ref 11–51)

## 2020-09-16 LAB — MAGNESIUM: Magnesium: 2.1 mg/dL (ref 1.7–2.4)

## 2020-09-16 LAB — PHOSPHORUS: Phosphorus: 4 mg/dL (ref 2.5–4.6)

## 2020-09-16 MED ORDER — SENNOSIDES-DOCUSATE SODIUM 8.6-50 MG PO TABS
2.0000 | ORAL_TABLET | Freq: Every day | ORAL | Status: DC
  Administered 2020-09-17: 2 via ORAL
  Filled 2020-09-16 (×3): qty 2

## 2020-09-16 MED ORDER — CLONIDINE HCL 0.1 MG PO TABS
0.1000 mg | ORAL_TABLET | Freq: Every day | ORAL | Status: AC
  Administered 2020-09-17: 0.1 mg via ORAL
  Filled 2020-09-16: qty 1

## 2020-09-16 MED ORDER — POLYETHYLENE GLYCOL 3350 17 G PO PACK
17.0000 g | PACK | Freq: Three times a day (TID) | ORAL | Status: DC
  Administered 2020-09-16 – 2020-09-18 (×4): 17 g via ORAL
  Filled 2020-09-16 (×5): qty 1

## 2020-09-16 MED ORDER — BISACODYL 10 MG RE SUPP: 10.0000 mg | Freq: Every day | RECTAL | Status: DC | PRN

## 2020-09-16 NOTE — Progress Notes (Signed)
Upper extremity venous LT study completed.  Preliminary results relayed to Lowell Guitar, MD via secure chat.  See CV Proc for preliminary results report.   Jean Rosenthal, RDMS, RVT

## 2020-09-16 NOTE — Evaluation (Signed)
Physical Therapy Evaluation Patient Details Name: Bruce Moreno MRN: [id number] DOB: 29-Jun-1970 Today's Date: 09/16/2020   History of Present Illness  50 y.o. male  Presented to ED with chest pain and left lateral pain shooting up his L arm and alcohol detox. . In ED, BP 181/112, ekg NSR with rate of 93, prolonged qt. CIWA score of 11. protocol started. Admitted 8/22 for treatment of chest pain in presence of alcohol and cocaine abuse. PMH: alcohol abuse, cocaine abuse, HTN, CHF, HLD, tobacco abuse  Clinical Impression  PTA pt living alone in single story apartment with level entry. Pt reports independence in mobility and ADLs, driving himself to Cardiac Rehab daily. Pt is currently limited in safe mobility by decreased cognition, in particular safety awareness, pain in chest and L UE, in presence of decreased balance and endurance. Pt is min guard for bed mobility and transfers and contact guard for ambulation but requires maxA for 2x LoB. PT recommends pt return to his Cardiac Rehab program at discharge. PT will continue to work with pt acutely on balance.     Follow Up Recommendations No PT follow up;Other (comment) (return to Cardiac Rehab)    Equipment Recommendations  None recommended by PT       Precautions / Restrictions Precautions Precautions: Fall      Mobility  Bed Mobility Overal bed mobility: Needs Assistance Bed Mobility: Supine to Sit;Sit to Supine     Supine to sit: Min guard Sit to supine: Min guard   General bed mobility comments: min guard for heavy use of momentum to come to EoB,has dizziness coming to upright, BP in line with supine,  returns to supine with decreased eccentric control    Transfers Overall transfer level: Needs assistance   Transfers: Sit to/from Stand Sit to Stand: Min guard         General transfer comment: min guard for power up, unable to attain balance in standing and sits back down quickly, able to come to standing and steady  himself with posterior support from bed on LE, again states he is dizzy BP in line with previous values.  Ambulation/Gait Ambulation/Gait assistance: Min assist;Max assist Gait Distance (Feet): 250 Feet Assistive device: None Gait Pattern/deviations: Step-through pattern;Decreased step length - right;Decreased step length - left;Drifts right/left Gait velocity: slowed Gait velocity interpretation: 1.31 - 2.62 ft/sec, indicative of limited community ambulator General Gait Details: generally contact guard assist however experiences 2x LoB requiring maxA from therapist to steady, pt reports this is new     Balance Overall balance assessment: Needs assistance Sitting-balance support: No upper extremity supported;Feet supported Sitting balance-Leahy Scale: Fair     Standing balance support: No upper extremity supported Standing balance-Leahy Scale: Fair Standing balance comment: requires increased effort to gain balance with positional change                             Pertinent Vitals/Pain Pain Assessment: Faces Faces Pain Scale: Hurts little more Pain Location: constant chest and L UE pain in supine does not increased with activity today Pain Descriptors / Indicators: Aching;Sore;Tightness;Throbbing Pain Intervention(s): Limited activity within patient's tolerance;Monitored during session;Repositioned    Home Living Family/patient expects to be discharged to:: Private residence Living Arrangements: Alone Available Help at Discharge: Friend(s);Available PRN/intermittently Type of Home: Apartment Home Access: Level entry     Home Layout: One level Home Equipment: None      Prior Function Level of Independence: Independent  Comments: driving to Cardiac rehab        Extremity/Trunk Assessment   Upper Extremity Assessment Upper Extremity Assessment: Generalized weakness    Lower Extremity Assessment Lower Extremity Assessment: Generalized  weakness       Communication   Communication: No difficulties  Cognition Arousal/Alertness: Lethargic Behavior During Therapy: Flat affect Overall Cognitive Status: Impaired/Different from baseline Area of Impairment: Orientation;Attention;Following commands;Safety/judgement;Awareness;Problem solving                 Orientation Level: Disoriented to;Place;Time Current Attention Level: Selective   Following Commands: Follows one step commands with increased time;Follows multi-step commands with increased time Safety/Judgement: Decreased awareness of safety;Decreased awareness of deficits Awareness: Emergent Problem Solving: Slow processing;Decreased initiation;Requires verbal cues;Requires tactile cues General Comments: pt falls asleep during interview in supine, so moved to EoB and pt again falls asleep. Not sure where he is and is very slow to process information,      General Comments General comments (skin integrity, edema, etc.): Pt with limited patience for evaluation, BP 100/64 with standing and 101/76 after ambulation, cardiac leads fell off during balance adjustment however max HR noted with replacement during ambulation high 90s.     Assessment/Plan    PT Assessment Patient needs continued PT services  PT Problem List Decreased activity tolerance;Decreased balance;Decreased mobility;Cardiopulmonary status limiting activity;Decreased safety awareness;Decreased cognition;Decreased coordination;Pain       PT Treatment Interventions DME instruction;Gait training;Functional mobility training;Therapeutic activities;Therapeutic exercise;Balance training;Cognitive remediation;Patient/family education    PT Goals (Current goals can be found in the Care Plan section)  Acute Rehab PT Goals Patient Stated Goal: stop drinking PT Goal Formulation: With patient Time For Goal Achievement: 09/30/20 Potential to Achieve Goals: Fair    Frequency Min 3X/week    AM-PAC PT "6  Clicks" Mobility  Outcome Measure Help needed turning from your back to your side while in a flat bed without using bedrails?: None Help needed moving from lying on your back to sitting on the side of a flat bed without using bedrails?: None Help needed moving to and from a bed to a chair (including a wheelchair)?: A Little Help needed standing up from a chair using your arms (e.g., wheelchair or bedside chair)?: A Little Help needed to walk in hospital room?: A Lot Help needed climbing 3-5 steps with a railing? : A Lot 6 Click Score: 18    End of Session Equipment Utilized During Treatment: Gait belt Activity Tolerance: Patient limited by lethargy Patient left: in bed;with call bell/phone within reach;with bed alarm set Nurse Communication: Mobility status PT Visit Diagnosis: Unsteadiness on feet (R26.81);Other abnormalities of gait and mobility (R26.89);Muscle weakness (generalized) (M62.81);History of falling (Z91.81)    Time: 8250-5397 PT Time Calculation (min) (ACUTE ONLY): 19 min   Charges:   PT Evaluation $PT Eval Moderate Complexity: 1 Mod          Avacyn Kloosterman B. Beverely Risen PT, DPT Acute Rehabilitation Services Pager [phone] Office [phone]   Elon Alas [facility] 09/16/2020, 12:46 PM

## 2020-09-16 NOTE — Progress Notes (Signed)
PROGRESS NOTE    Bruce Moreno  PQZ:[id number] DOB: 10/26/1970 DOA: 09/12/2020 PCP: Maryelizabeth Rowan, MD   No chief complaint on file.  Brief Narrative:  Bruce Moreno is Bruce Moreno 50 y.o. male with Tanvir Hipple history of alcohol, cocaine and tobacco abuse, chronic HFrEF, HTN, HLD, and substance-induced mood disorder who presented to the ED 8/22 with severe left chest pain. ECG and cardiac enzymes were reassuring, though he was severely hypertensive and reporting desire to detox from alcohol. Started on CIWA, BP medications augmented, and he was admitted.  Assessment & Plan:   Principal Problem:   Chest pain on exertion Active Problems:   Alcohol use disorder, severe, dependence (HCC)   Cocaine abuse (HCC)   HFrEF (heart failure with reduced ejection fraction) (HCC)   HTN (hypertension)   Pure hypercholesterolemia   Chest pain  Nausea  Vomiting Exam not particularly concerning - lipase wnl, LFT's with mildly elevated ALT KUB notes stool burden Will treat constipation aggressively, if not improving, consider additional imaging with CT  Alcohol abuse and withdrawal: States he drinks brown liquor daily, usually 1/2 gallon. Last drink 4am on 8/22. Began drinking at age 89, has been off and on, heavily drinking since Feb 2022 when his nephew, whom he raised, died.  - Continue CIWA - seems to be improving.  He's high risk for complicated withdrawal - continue librium taper. - TOC consulted, will need resources and psychosocial support.   Chest pain: Cardiac enzymes have been reassuring. Resolved. Treadmill stress test Jul 2022 showed no ST changes or arrhythmias. LHC and RHC April 2022 showed no significant coronary disease, normal filling pressures, and low-normal cardiac output. - Continue monitoring with cardiac medications continued as below.    Chronic combined HFrEF:  - Continue home metoprolol, entresto, spironolactone, empagliflozin. Can continue prn lasix, though does not appear volume  overloaded.  - Monitor I/O, weights.  - Follow up with cardiology as outpatient.   HTN urgency: Remains severely elevated - Add clonidine as adjunct with alcohol withdrawal as well - BP improved, follow - try to taper off clonidine - Continue home medications as above.   HLD:  - Continue statin   Cocaine use: +UDS 3 weeks ago but only +THC at admission here.  - Cessation counseling reviewed.    QT prolongation:  - Monitor on telemetry, monitor electrolytes, avoid provocative agents.  - repeat   Substance-induced mood disorder: Notes reviewed from recent hospitalization at [facility] for SI. No current complaints or reports of thoughts of self harm.   Cervical disc disease with left radiculopathy: Stable symptoms per patient.  - Trial flexeril prn - Follow up with outpatient provider at [facility] for ongoing injections.    Constipation:  - Start scheduled bowel regimen.   Obesity: Estimated body mass index is 31.67 kg/m as calculated from the following:  DVT prophylaxis: lovenox Code Status: full  Family Communication: none at bedside Disposition:   Status is: Inpatient  Remains inpatient appropriate because:Inpatient level of care appropriate due to severity of illness  Dispo: The patient is from: Home              Anticipated d/c is to: Home              Patient currently is not medically stable to d/c.   Difficult to place patient No       Consultants:  none  Procedures:  none  Antimicrobials:  Anti-infectives (From admission, onward)    None  Subjective: C/o nausea/vomting  Objective: Vitals:   09/16/20 0159 09/16/20 0521 09/16/20 0918 09/16/20 1201  BP: 121/84 115/67  99/64  Pulse: 79 71  80  Resp:  15  18  Temp:  (!) 97.4 F (36.3 C)  98.3 F (36.8 C)  TempSrc:  Oral  Oral  SpO2:  98%    Weight:   87.6 kg   Height:        Intake/Output Summary (Last 24 hours) at 09/16/2020 1700 Last data filed at 09/16/2020 0522 Gross per 24 hour   Intake 290 ml  Output 900 ml  Net -610 ml   Filed Weights   09/14/20 0436 09/15/20 0435 09/16/20 0918  Weight: 89.2 kg 87.4 kg 87.6 kg    Examination:  General: No acute distress. Cardiovascular: RRR Lungs: unlabored Abdomen: Soft, nontender, nondistended  Neurological: Alert and oriented 3. Moves all extremities 4. Cranial nerves II through XII grossly intact. Skin: Warm and dry. No rashes or lesions. Extremities: No clubbing or cyanosis. No edema.   Data Reviewed: I have personally reviewed following labs and imaging studies  CBC: Recent Labs  Lab 09/12/20 1108 09/14/20 1924 09/15/20 0308 09/16/20 0144  WBC 4.1 6.9 6.5 6.6  NEUTROABS  --   --  3.8 3.4  HGB 13.2 14.4 13.1 13.3  HCT 40.7 42.1 38.8* 39.4  MCV 95.8 91.7 93.0 93.1  PLT 191 221 215 203    Basic Metabolic Panel: Recent Labs  Lab 09/12/20 1108 09/12/20 2131 09/14/20 0205 09/14/20 1924 09/15/20 0308 09/16/20 0144  NA 140  --  135 136 135 136  K 4.1  --  4.3 4.2 3.8 3.9  CL 106  --  103 104 104 106  CO2 26  --  24 24 25 24   GLUCOSE 110*  --  102* 129* 125* 108*  BUN 10  --  8 12 12 12   CREATININE 0.94  --  0.96 1.14 1.00 1.13  CALCIUM 9.4  --  9.6 9.5 9.5 9.4  MG  --  1.9  --  2.2 2.1 2.1  PHOS  --   --   --  4.1 4.3 4.0    GFR: Estimated Creatinine Clearance: 80.2 mL/min (by C-G formula based on SCr of 1.13 mg/dL).  Liver Function Tests: Recent Labs  Lab 09/14/20 0205 09/14/20 1924 09/15/20 0308 09/16/20 0144  AST 22 27 25  32  ALT 33 37 36 47*  ALKPHOS 71 74 64 68  BILITOT 0.5 0.5 0.5 0.6  PROT 7.4 7.5 7.0 7.1  ALBUMIN 3.5 3.6 3.4* 3.5    CBG: No results for input(s): GLUCAP in the last 168 hours.   Recent Results (from the past 240 hour(s))  SARS CORONAVIRUS 2 (TAT 6-24 HRS) Nasopharyngeal Nasopharyngeal Swab     Status: None   Collection Time: 09/12/20  3:45 PM   Specimen: Nasopharyngeal Swab  Result Value Ref Range Status   SARS Coronavirus 2 NEGATIVE NEGATIVE Final     Comment: (NOTE) SARS-CoV-2 target nucleic acids are NOT DETECTED.  The SARS-CoV-2 RNA is generally detectable in upper and lower respiratory specimens during the acute phase of infection. Negative results do not preclude SARS-CoV-2 infection, do not rule out co-infections with other pathogens, and should not be used as the sole basis for treatment or other patient management decisions. Negative results must be combined with clinical observations, patient history, and epidemiological information. The expected result is Negative.  Fact Sheet for Patients: 09/18/20  Fact Sheet for Healthcare Providers:  This test is not yet approved or cleared by the [place] FDA and  has been authorized for detection and/or diagnosis of SARS-CoV-2 by FDA under an Emergency Use Authorization (EUA). This EUA will remain  in effect (meaning this test can be used) for the duration of the COVID-19 declaration under Se ction 564(b)(1) of the Act, 21 U.S.C. section 360bbb-3(b)(1), unless the authorization is terminated or revoked sooner.  Performed at [facility], [address] [place], [place] [address]          Radiology Studies: DG Abd 1 View  Result Date: 09/16/2020 CLINICAL DATA:  Nausea and vomiting. EXAM: ABDOMEN - 1 VIEW COMPARISON:  None. FINDINGS: Nonobstructive bowel gas pattern. Moderate to large amount of stool in the abdomen. No large abdominal or pelvic calcifications. Visualized bone structures are unremarkable. IMPRESSION: 1. Nonobstructive bowel gas pattern. 2. Moderate to large amount of stool in the abdomen. Electronically Signed   By: Richarda OverlieAdam  Henn M.D.   On: 09/16/2020 13:58   VAS US UPPER EXTREMITY VENOUS DUPLEX  Result Date: 09/16/2020 UPPER VENOUS STUDY  Patient Name:  [facility]  Date of Exam:   09/16/2020 Medical Rec #: [id number]      Accession #:    [id number] Date of Birth:  04/06/1970      Patient Gender: M Patient Age:   [age] years [facility]  Procedure:      VAS US UPPER EXTREMITY VENOUS DUPLEX Referring Phys: Lashara Urey POWELL JR --------------------------------------------------------------------------------  Indications: Pain Comparison Study: No prior studies. Performing Technologist: Jean Rosenthalachel Hodge RDMS, RVT  Examination Guidelines: Shardee Dieu complete evaluation includes B-mode imaging, spectral Doppler, color Doppler, and power Doppler as needed of all accessible portions of each vessel. Bilateral testing is considered an integral part of Chenae Brager complete examination. Limited examinations for reoccurring indications may be performed as noted.  Left Findings: +----------+------------+---------+-----------+----------+-----------------+ LEFT      CompressiblePhasicitySpontaneousProperties     Summary      +----------+------------+---------+-----------+----------+-----------------+ IJV           Full       Yes       Yes                                +----------+------------+---------+-----------+----------+-----------------+ Subclavian               Yes       Yes                                +----------+------------+---------+-----------+----------+-----------------+ Axillary      Full       Yes       Yes                                +----------+------------+---------+-----------+----------+-----------------+ Brachial      Full                                                    +----------+------------+---------+-----------+----------+-----------------+ Radial        Full                                                    +----------+------------+---------+-----------+----------+-----------------+  Ulnar         Full                                                    +----------+------------+---------+-----------+----------+-----------------+ Cephalic    Partial      Yes       Yes              Age Indeterminate  +----------+------------+---------+-----------+----------+-----------------+ Basilic       Full                                                    +----------+------------+---------+-----------+----------+-----------------+  Summary:  Left: No evidence of deep vein thrombosis in the upper extremity. Findings consistent with age indeterminate superficial vein thrombosis involving the left cephalic vein at the level of the antecubital fossa.  *See table(s) above for measurements and observations.    Preliminary         Scheduled Meds:  atorvastatin  40 mg Oral Daily   chlordiazePOXIDE  25 mg Oral BID   Followed by   Melene Muller ON 09/17/2020] chlordiazePOXIDE  25 mg Oral QHS   cloNIDine  0.1 mg Oral BID   empagliflozin  10 mg Oral Daily   enoxaparin (LOVENOX) injection  40 mg Subcutaneous Q24H   lidocaine  1 patch Transdermal Q24H   LORazepam  0-4 mg Intravenous Q6H   Followed by   LORazepam  0-4 mg Intravenous Q12H   metoprolol succinate  100 mg Oral BID   pantoprazole  40 mg Oral Daily   polyethylene glycol  17 g Oral TID   sacubitril-valsartan  1 tablet Oral BID   senna-docusate  2 tablet Oral QHS   sodium chloride flush  3 mL Intravenous Q12H   spironolactone  25 mg Oral Daily   [START ON 09/23/2020] thiamine  100 mg Oral Daily   Continuous Infusions:  sodium chloride Stopped (09/15/20 1548)   thiamine injection 500 mg (09/16/20 1528)   Followed by   Melene Muller ON 09/18/2020] thiamine injection       LOS: 4 days    Time spent: over 30 min    Lacretia Nicks, MD [facility]   To contact the attending provider between 7A-7P or the covering provider during after hours 7P-7A, please log into the web site [email or web address] and access using universal [place] password for that web site. If you do not have the password, please call the hospital operator.  09/16/2020, 5:00 PM

## 2020-09-17 DIAGNOSIS — R079 Chest pain, unspecified: Secondary | ICD-10-CM | POA: Diagnosis not present

## 2020-09-17 LAB — CBC WITH DIFFERENTIAL/PLATELET
Abs Immature Granulocytes: 0.03 10*3/uL (ref 0.00–0.07)
Basophils Absolute: 0 10*3/uL (ref 0.0–0.1)
Basophils Relative: 0 %
Eosinophils Absolute: 0.1 10*3/uL (ref 0.0–0.5)
Eosinophils Relative: 2 %
HCT: 37.9 % — ABNORMAL LOW (ref 39.0–52.0)
Hemoglobin: 12.4 g/dL — ABNORMAL LOW (ref 13.0–17.0)
Immature Granulocytes: 0 %
Lymphocytes Relative: 30 %
Lymphs Abs: 2.3 10*3/uL (ref 0.7–4.0)
MCH: 31.2 pg (ref 26.0–34.0)
MCHC: 32.7 g/dL (ref 30.0–36.0)
MCV: 95.2 fL (ref 80.0–100.0)
Monocytes Absolute: 0.7 10*3/uL (ref 0.1–1.0)
Monocytes Relative: 9 %
Neutro Abs: 4.4 10*3/uL (ref 1.7–7.7)
Neutrophils Relative %: 59 %
Platelets: 185 10*3/uL (ref 150–400)
RBC: 3.98 MIL/uL — ABNORMAL LOW (ref 4.22–5.81)
RDW: 13.3 % (ref 11.5–15.5)
WBC: 7.6 10*3/uL (ref 4.0–10.5)
nRBC: 0 % (ref 0.0–0.2)

## 2020-09-17 LAB — MAGNESIUM: Magnesium: 2 mg/dL (ref 1.7–2.4)

## 2020-09-17 LAB — COMPREHENSIVE METABOLIC PANEL
ALT: 50 U/L — ABNORMAL HIGH (ref 0–44)
AST: 27 U/L (ref 15–41)
Albumin: 3.3 g/dL — ABNORMAL LOW (ref 3.5–5.0)
Alkaline Phosphatase: 69 U/L (ref 38–126)
Anion gap: 8 (ref 5–15)
BUN: 11 mg/dL (ref 6–20)
CO2: 24 mmol/L (ref 22–32)
Calcium: 9.5 mg/dL (ref 8.9–10.3)
Chloride: 105 mmol/L (ref 98–111)
Creatinine, Ser: 1.12 mg/dL (ref 0.61–1.24)
GFR, Estimated: >60 mL/min (ref >60)
Glucose, Bld: 131 mg/dL — ABNORMAL HIGH (ref 70–99)
Potassium: 4.1 mmol/L (ref 3.5–5.1)
Sodium: 137 mmol/L (ref 135–145)
Total Bilirubin: 0.5 mg/dL (ref 0.3–1.2)
Total Protein: 6.7 g/dL (ref 6.5–8.1)

## 2020-09-17 LAB — PHOSPHORUS: Phosphorus: 3.5 mg/dL (ref 2.5–4.6)

## 2020-09-17 MED ORDER — NALTREXONE HCL 50 MG PO TABS
25.0000 mg | ORAL_TABLET | Freq: Every day | ORAL | Status: AC
  Administered 2020-09-17 – 2020-09-19 (×3): 25 mg via ORAL
  Filled 2020-09-17 (×5): qty 1

## 2020-09-17 MED ORDER — LACTULOSE 10 GM/15ML PO SOLN
30.0000 g | Freq: Every day | ORAL | Status: DC
  Administered 2020-09-19: 30 g via ORAL
  Filled 2020-09-17 (×3): qty 45

## 2020-09-17 MED ORDER — MELATONIN 3 MG PO TABS
3.0000 mg | ORAL_TABLET | Freq: Every evening | ORAL | Status: DC | PRN
  Administered 2020-09-17: 3 mg via ORAL
  Filled 2020-09-17: qty 1

## 2020-09-17 MED ORDER — NALTREXONE HCL 50 MG PO TABS
50.0000 mg | ORAL_TABLET | Freq: Every day | ORAL | Status: DC
  Filled 2020-09-17: qty 1

## 2020-09-17 MED ORDER — NALTREXONE HCL 50 MG PO TABS
50.0000 mg | ORAL_TABLET | Freq: Every day | ORAL | Status: DC
  Administered 2020-09-20: 50 mg via ORAL
  Filled 2020-09-17: qty 1

## 2020-09-17 NOTE — Plan of Care (Signed)
  Problem: Education: Goal: Knowledge of General Education information will improve Description: Including pain rating scale, medication(s)/side effects and non-pharmacologic comfort measures Outcome: Progressing   Problem: Clinical Measurements: Goal: Ability to maintain clinical measurements within normal limits will improve Outcome: Progressing   Problem: Clinical Measurements: Goal: Diagnostic test results will improve Outcome: Progressing   Problem: Clinical Measurements: Goal: Will remain free from infection Outcome: Progressing   Problem: Clinical Measurements: Goal: Cardiovascular complication will be avoided Outcome: Progressing   Problem: Coping: Goal: Level of anxiety will decrease Outcome: Progressing   Problem: Elimination: Goal: Will not experience complications related to bowel motility Outcome: Progressing   Problem: Pain Managment: Goal: General experience of comfort will improve Outcome: Progressing   Problem: Safety: Goal: Ability to remain free from injury will improve Outcome: Progressing

## 2020-09-17 NOTE — Progress Notes (Signed)
PROGRESS NOTE    Bruce Moreno  XAJ:[id number] DOB: 1970-09-28 DOA: 09/12/2020 PCP: Maryelizabeth Rowan, MD   No chief complaint on file.  Brief Narrative:  Bruce Moreno is Bruce Moreno 50 y.o. male with Indianna Boran history of alcohol, cocaine and tobacco abuse, chronic HFrEF, HTN, HLD, and substance-induced mood disorder who presented to the ED 8/22 with severe left chest pain. ECG and cardiac enzymes were reassuring, though he was severely hypertensive and reporting desire to detox from alcohol. Started on CIWA, BP medications augmented, and he was admitted.  Assessment & Plan:   Principal Problem:   Chest pain on exertion Active Problems:   Alcohol use disorder, severe, dependence (HCC)   Cocaine abuse (HCC)   HFrEF (heart failure with reduced ejection fraction) (HCC)   HTN (hypertension)   Pure hypercholesterolemia   Chest pain  Depression  Anxiety  Etoh Use Disorder Recent hospitalization at [facility] for SI Denies SI at this time Not on any meds at this time for mood sx -> but seems to be significantly anxious with depressive symptoms - likely related to withdrawal (treating as below) Will c/s psych for recs as well  Nausea  Vomiting Exam not particularly concerning - lipase wnl, LFT's with mildly elevated ALT KUB notes stool burden Seems improved today Will treat constipation aggressively, if not improving, consider additional imaging with CT  Alcohol abuse and withdrawal: States he drinks brown liquor daily, usually 1/2 gallon. Last drink 4am on 8/22. Began drinking at age 51, has been off and on, heavily drinking since Feb 2022 when his nephew, whom he raised, died.  - Continue CIWA - seems to be improving - but 16 this afternoon.  He's high risk for complicated withdrawal - continue librium taper. - desires abstinence, discussed meds - he's open to naltrexone  - TOC consulted, will need resources and psychosocial support.   Chest pain: Cardiac enzymes have been reassuring. Resolved.  Treadmill stress test Jul 2022 showed no ST changes or arrhythmias. LHC and RHC April 2022 showed no significant coronary disease, normal filling pressures, and low-normal cardiac output. - Continue monitoring with cardiac medications continued as below.    Chronic combined HFrEF:  - Continue home metoprolol, entresto, spironolactone, empagliflozin. Can continue prn lasix, though does not appear volume overloaded.  - Monitor I/O, weights.  - Follow up with cardiology as outpatient.   HTN urgency: Remains severely elevated - Add clonidine as adjunct with alcohol withdrawal as well - BP improved, follow - try to taper off clonidine - Continue home medications as above.   HLD:  - Continue statin   Cocaine use: +UDS 3 weeks ago but only +THC at admission here.  - Cessation counseling reviewed.    QT prolongation:  - Monitor on telemetry, monitor electrolytes, avoid provocative agents.  - repeat   Substance-induced mood disorder: Notes reviewed from recent hospitalization at [facility] for SI. No current complaints or reports of thoughts of self harm.   Cervical disc disease with left radiculopathy: Stable symptoms per patient.  - Trial flexeril prn - Follow up with outpatient provider at [facility] for ongoing injections.    Constipation:  - Start scheduled bowel regimen.   Obesity: Estimated body mass index is 31.67 kg/m as calculated from the following:  Impulsive today, walked off unit ("said he was just trying it out").  Discussed not leaving unit.    DVT prophylaxis: lovenox Code Status: full  Family Communication: none at bedside Disposition:   Status is: Inpatient  Remains inpatient appropriate  because:Inpatient level of care appropriate due to severity of illness  Dispo: The patient is from: Home              Anticipated d/c is to: Home              Patient currently is not medically stable to d/c.   Difficult to place patient No       Consultants:  none  Procedures:   none  Antimicrobials:  Anti-infectives (From admission, onward)    None          Subjective: Anxious, notes desire to quit alcohol, but not sure if he can  Objective: Vitals:   09/17/20 0631 09/17/20 0947 09/17/20 1057 09/17/20 1600  BP: 126/87 124/84 (!) 114/92   Pulse: 71 88 90 100  Resp: 20  16   Temp: 97.7 F (36.5 C)  98.3 F (36.8 C)   TempSrc: Oral  Oral   SpO2: 100%  99%   Weight:      Height:        Intake/Output Summary (Last 24 hours) at 09/17/2020 1842 Last data filed at 09/17/2020 1700 Gross per 24 hour  Intake 480 ml  Output 200 ml  Net 280 ml   Filed Weights   09/14/20 0436 09/15/20 0435 09/16/20 0918  Weight: 89.2 kg 87.4 kg 87.6 kg    Examination:  General: No acute distress. Cardiovascular: RRR Lungs: unlabored Abdomen: Soft, nontender, nondistended  Neurological: Alert and oriented 3. Moves all extremities 4. Cranial nerves II through XII grossly intact. Skin: Warm and dry. No rashes or lesions. Extremities: No clubbing or cyanosis. No edema.   Data Reviewed: I have personally reviewed following labs and imaging studies  CBC: Recent Labs  Lab 09/12/20 1108 09/14/20 1924 09/15/20 0308 09/16/20 0144 09/17/20 0320  WBC 4.1 6.9 6.5 6.6 7.6  NEUTROABS  --   --  3.8 3.4 4.4  HGB 13.2 14.4 13.1 13.3 12.4*  HCT 40.7 42.1 38.8* 39.4 37.9*  MCV 95.8 91.7 93.0 93.1 95.2  PLT 191 221 215 203 185    Basic Metabolic Panel: Recent Labs  Lab 09/12/20 2131 09/14/20 0205 09/14/20 1924 09/15/20 0308 09/16/20 0144 09/17/20 0320  NA  --  135 136 135 136 137  K  --  4.3 4.2 3.8 3.9 4.1  CL  --  103 104 104 106 105  CO2  --  GLUCOSE  --  102* 129* 125* 108* 131*  BUN  --  CREATININE  --  0.96 1.14 1.00 1.13 1.12  CALCIUM  --  9.6 9.5 9.5 9.4 9.5  MG 1.9  --  2.2 2.1 2.1 2.0  PHOS  --   --  4.1 4.3 4.0 3.5    GFR: Estimated Creatinine Clearance: 80.9 mL/min (by C-G formula based on SCr of 1.12  mg/dL).  Liver Function Tests: Recent Labs  Lab 09/14/20 0205 09/14/20 1924 09/15/20 0308 09/16/20 0144 09/17/20 0320  AST 32 27  ALT 33 37 36 47* 50*  ALKPHOS 71 74 64 68 69  BILITOT 0.5 0.5 0.5 0.6 0.5  PROT 7.4 7.5 7.0 7.1 6.7  ALBUMIN 3.5 3.6 3.4* 3.5 3.3*    CBG: No results for input(s): GLUCAP in the last 168 hours.   Recent Results (from the past 240 hour(s))  SARS CORONAVIRUS 2 (TAT 6-24 HRS) Nasopharyngeal Nasopharyngeal Swab     Status: None   Collection  Time: 09/12/20  3:45 PM   Specimen: Nasopharyngeal Swab  Result Value Ref Range Status   SARS Coronavirus 2 NEGATIVE NEGATIVE Final    Comment: (NOTE) SARS-CoV-2 target nucleic acids are NOT DETECTED.  The SARS-CoV-2 RNA is generally detectable in upper and lower respiratory specimens during the acute phase of infection. Negative results do not preclude SARS-CoV-2 infection, do not rule out co-infections with other pathogens, and should not be used as the sole basis for treatment or other patient management decisions. Negative results must be combined with clinical observations, patient history, and epidemiological information. The expected result is Negative.  Fact Sheet for Patients: HairSlick.no  Fact Sheet for Healthcare Providers: quierodirigir.com  This test is not yet approved or cleared by the [place] FDA and  has been authorized for detection and/or diagnosis of SARS-CoV-2 by FDA under an Emergency Use Authorization (EUA). This EUA will remain  in effect (meaning this test can be used) for the duration of the COVID-19 declaration under Se ction 564(b)(1) of the Act, 21 U.S.C. section 360bbb-3(b)(1), unless the authorization is terminated or revoked sooner.  Performed at [facility], [address] [place], [place] [address]          Radiology Studies: DG Abd 1 View  Result Date: 09/16/2020 CLINICAL DATA:   Nausea and vomiting. EXAM: ABDOMEN - 1 VIEW COMPARISON:  None. FINDINGS: Nonobstructive bowel gas pattern. Moderate to large amount of stool in the abdomen. No large abdominal or pelvic calcifications. Visualized bone structures are unremarkable. IMPRESSION: 1. Nonobstructive bowel gas pattern. 2. Moderate to large amount of stool in the abdomen. Electronically Signed   By: Richarda Overlie M.D.   On: 09/16/2020 13:58   VAS [place] UPPER EXTREMITY VENOUS DUPLEX  Result Date: 09/16/2020 UPPER VENOUS STUDY  Patient Name:  SEQUOIA [facility]  Date of Exam:   09/16/2020 Medical Rec #: [id number]      Accession #:    [id number] Date of Birth: [birth year]      Patient Gender: M Patient Age:   26 years Exam Location:  [facility] Procedure:      VAS [place] UPPER EXTREMITY VENOUS DUPLEX Referring Phys: Joban Colledge POWELL JR --------------------------------------------------------------------------------  Indications: Pain Comparison Study: No prior studies. Performing Technologist: Jean Rosenthal RDMS, RVT  Examination Guidelines: Almendra Loria complete evaluation includes B-mode imaging, spectral Doppler, color Doppler, and power Doppler as needed of all accessible portions of each vessel. Bilateral testing is considered an integral part of Annmarie Plemmons complete examination. Limited examinations for reoccurring indications may be performed as noted.  Left Findings: +----------+------------+---------+-----------+----------+-----------------+ LEFT      CompressiblePhasicitySpontaneousProperties     Summary      +----------+------------+---------+-----------+----------+-----------------+ IJV           Full       Yes       Yes                                +----------+------------+---------+-----------+----------+-----------------+ Subclavian               Yes       Yes                                +----------+------------+---------+-----------+----------+-----------------+ Axillary      Full       Yes       Yes                                 +----------+------------+---------+-----------+----------+-----------------+  Brachial      Full                                                    +----------+------------+---------+-----------+----------+-----------------+ Radial        Full                                                    +----------+------------+---------+-----------+----------+-----------------+ Ulnar         Full                                                    +----------+------------+---------+-----------+----------+-----------------+ Cephalic    Partial      Yes       Yes              Age Indeterminate +----------+------------+---------+-----------+----------+-----------------+ Basilic       Full                                                    +----------+------------+---------+-----------+----------+-----------------+  Summary:  Left: No evidence of deep vein thrombosis in the upper extremity. Findings consistent with age indeterminate superficial vein thrombosis involving the left cephalic vein at the level of the antecubital fossa.  *See table(s) above for measurements and observations.  Diagnosing physician: Sherald Hesshristopher Clark MD Electronically signed by Sherald Hesshristopher Clark MD on 09/16/2020 at 6:02:54 PM.    Final         Scheduled Meds:  atorvastatin  40 mg Oral Daily   chlordiazePOXIDE  25 mg Oral QHS   empagliflozin  10 mg Oral Daily   enoxaparin (LOVENOX) injection  40 mg Subcutaneous Q24H   lactulose  30 g Oral Daily   lidocaine  1 patch Transdermal Q24H   LORazepam  0-4 mg Intravenous Q12H   metoprolol succinate  100 mg Oral BID   naltrexone  25 mg Oral Daily   Followed by   Melene Muller[START ON 09/20/2020] naltrexone  50 mg Oral Daily   pantoprazole  40 mg Oral Daily   polyethylene glycol  17 g Oral TID   sacubitril-valsartan  1 tablet Oral BID   senna-docusate  2 tablet Oral QHS   sodium chloride flush  3 mL Intravenous Q12H   spironolactone  25 mg Oral Daily   [START ON 09/23/2020]  thiamine  100 mg Oral Daily   Continuous Infusions:  sodium chloride Stopped (09/15/20 1548)   thiamine injection 500 mg (09/17/20 16100648)   Followed by   Melene Muller[START ON 09/18/2020] thiamine injection       LOS: 5 days    Time spent: over 30 min    Lacretia Nicksaldwell Powell, MD [facility]   To contact the attending provider between 7A-7P or the covering provider during after hours 7P-7A, please log into the web site [email or web address] and access using universal [place] password for that web site. If you do not have the password, please call the  hospital operator.  09/17/2020, 6:42 PM

## 2020-09-18 DIAGNOSIS — R079 Chest pain, unspecified: Secondary | ICD-10-CM | POA: Diagnosis not present

## 2020-09-18 MED ORDER — POLYETHYLENE GLYCOL 3350 17 G PO PACK
17.0000 g | PACK | Freq: Every day | ORAL | Status: DC
  Filled 2020-09-18: qty 1

## 2020-09-18 MED ORDER — QUETIAPINE FUMARATE 50 MG PO TABS
50.0000 mg | ORAL_TABLET | Freq: Every day | ORAL | Status: DC
  Administered 2020-09-18 – 2020-09-19 (×2): 50 mg via ORAL
  Filled 2020-09-18 (×2): qty 1

## 2020-09-18 NOTE — Progress Notes (Signed)
PROGRESS NOTE    Bruce Moreno  WGN:[id number] DOB: [birth year] DOA: 09/12/2020 PCP: Maryelizabeth Rowan, MD   No chief complaint on file.  Brief Narrative:  Bruce Moreno is Bruce Moreno 50 y.o. male with Cohen Doleman history of alcohol, cocaine and tobacco abuse, chronic HFrEF, HTN, HLD, and substance-induced mood disorder who presented to the ED 8/22 with severe left chest pain. ECG and cardiac enzymes were reassuring, though he was severely hypertensive and reporting desire to detox from alcohol. Started on CIWA, BP medications augmented, and he was admitted.  Assessment & Plan:   Principal Problem:   Chest pain on exertion Active Problems:   Alcohol use disorder, severe, dependence (HCC)   Cocaine abuse (HCC)   HFrEF (heart failure with reduced ejection fraction) (HCC)   HTN (hypertension)   Pure hypercholesterolemia   Chest pain  Depression  Anxiety  Etoh Use Disorder Recent hospitalization at [facility] for SI Denies SI at this time Not on any meds at this time for mood sx -> but seems to be significantly anxious with depressive symptoms - likely related to withdrawal (treating as below) Will c/s psych for recs as well - as below, recommending seroquel   Nausea  Vomiting Exam not particularly concerning - lipase wnl, LFT's with mildly elevated ALT KUB notes stool burden Seems improved today Will treat constipation aggressively, if not improving, consider additional imaging with CT  Alcohol abuse and withdrawal: States he drinks brown liquor daily, usually 1/2 gallon. Last drink 4am on 8/22. Began drinking at age 73, has been off and on, heavily drinking since Feb 2022 when his nephew, whom he raised, died.  - Continue CIWA - fluctuating.  He's high risk for complicated withdrawal - librium taper. - desires abstinence, discussed meds - he's open to naltrexone, started this - TOC consulted, will need resources and psychosocial support.   Chest pain: Cardiac enzymes have been reassuring. Resolved.  Treadmill stress test Jul 2022 showed no ST changes or arrhythmias. LHC and RHC April 2022 showed no significant coronary disease, normal filling pressures, and low-normal cardiac output. - Continue monitoring with cardiac medications continued as below.    Chronic combined HFrEF:  - Continue home metoprolol, entresto, spironolactone, empagliflozin. Can continue prn lasix, though does not appear volume overloaded.  - Monitor I/O, weights.  - Follow up with cardiology as outpatient.   HTN urgency: Remains severely elevated - Add clonidine as adjunct with alcohol withdrawal as well - BP improved, follow - try to taper off clonidine - Continue home medications as above.   HLD:  - Continue statin   Cocaine use: +UDS 3 weeks ago but only +THC at admission here.  - Cessation counseling reviewed.    QT prolongation:  - Monitor on telemetry, monitor electrolytes, avoid provocative agents.  - repeat EKG 8/26 439   Substance-induced mood disorder: Notes reviewed from recent hospitalization at [facility] for SI. No current complaints or reports of thoughts of self harm. - appreciate psychiatry recs - recommending restarting seroquel (discussed dosing and recommended 50 mg) (EKG from 8/26 reviewed, normal Qtc)   Cervical disc disease with left radiculopathy: Stable symptoms per patient.  - Trial flexeril prn - Follow up with outpatient provider at [facility] for ongoing injections.    Constipation:  - Start scheduled bowel regimen.   Obesity: Estimated body mass index is 31.67 kg/m as calculated from the following:  DVT prophylaxis: lovenox Code Status: full  Family Communication: none at bedside Disposition:   Status is: Inpatient  Remains inpatient  appropriate because:Inpatient level of care appropriate due to severity of illness  Dispo: The patient is from: Home              Anticipated d/c is to: Home              Patient currently is not medically stable to d/c.   Difficult to place  patient No       Consultants:  none  Procedures:  none  Antimicrobials:  Anti-infectives (From admission, onward)    None          Subjective: No complaints today Sleepy after receiving med  Objective: Vitals:   09/18/20 0628 09/18/20 0755 09/18/20 0851 09/18/20 1357  BP: 132/89  (!) 132/92 124/84  Pulse: 79 71 91 79  Resp: 19  18 17   Temp: 98.7 F (37.1 C)  97.6 F (36.4 C) 97.9 F (36.6 C)  TempSrc: Oral  Oral Oral  SpO2: 100%  100% 100%  Weight:      Height:        Intake/Output Summary (Last 24 hours) at 09/18/2020 1654 Last data filed at 09/18/2020 0400 Gross per 24 hour  Intake 720 ml  Output 850 ml  Net -130 ml   Filed Weights   09/15/20 0435 09/16/20 0918 09/18/20 0500  Weight: 87.4 kg 87.6 kg 87.1 kg    Examination:  General: No acute distress. Cardiovascular: RRR Lungs: unlabored Abdomen: Soft, nontender, nondistended  Neurological: Alert and oriented 3. Moves all extremities 4 . Cranial nerves II through XII grossly intact. Skin: Warm and dry. No rashes or lesions. Extremities: No clubbing or cyanosis. No edema.  Data Reviewed: I have personally reviewed following labs and imaging studies  CBC: Recent Labs  Lab 09/12/20 1108 09/14/20 1924 09/15/20 0308 09/16/20 0144 09/17/20 0320  WBC 4.1 6.9 6.5 6.6 7.6  NEUTROABS  --   --  3.8 3.4 4.4  HGB 13.2 14.4 13.1 13.3 12.4*  HCT 40.7 42.1 38.8* 39.4 37.9*  MCV 95.8 91.7 93.0 93.1 95.2  PLT 191 221 215 203 185    Basic Metabolic Panel: Recent Labs  Lab 09/12/20 2131 09/14/20 0205 09/14/20 1924 09/15/20 0308 09/16/20 0144 09/17/20 0320  NA  --  135 136 135 136 137  K  --  4.3 4.2 3.8 3.9 4.1  CL  --  103 104 104 106 105  CO2  --  24 24 25 24 24   GLUCOSE  --  102* 129* 125* 108* 131*  BUN  --  8 12 12 12 11   CREATININE  --  0.96 1.14 1.00 1.13 1.12  CALCIUM  --  9.6 9.5 9.5 9.4 9.5  MG 1.9  --  2.2 2.1 2.1 2.0  PHOS  --   --  4.1 4.3 4.0 3.5    GFR: Estimated  Creatinine Clearance: 80.7 mL/min (by C-G formula based on SCr of 1.12 mg/dL).  Liver Function Tests: Recent Labs  Lab 09/14/20 0205 09/14/20 1924 09/15/20 0308 09/16/20 0144 09/17/20 0320  AST 22 27 25  32 27  ALT 33 37 36 47* 50*  ALKPHOS 71 74 64 68 69  BILITOT 0.5 0.5 0.5 0.6 0.5  PROT 7.4 7.5 7.0 7.1 6.7  ALBUMIN 3.5 3.6 3.4* 3.5 3.3*    CBG: No results for input(s): GLUCAP in the last 168 hours.   Recent Results (from the past 240 hour(s))  SARS CORONAVIRUS 2 (TAT 6-24 HRS) Nasopharyngeal Nasopharyngeal Swab     Status: None   Collection Time:  09/12/20  3:45 PM   Specimen: Nasopharyngeal Swab  Result Value Ref Range Status   SARS Coronavirus 2 NEGATIVE NEGATIVE Final    Comment: (NOTE) SARS-CoV-2 target nucleic acids are NOT DETECTED.  The SARS-CoV-2 RNA is generally detectable in upper and lower respiratory specimens during the acute phase of infection. Negative results do not preclude SARS-CoV-2 infection, do not rule out co-infections with other pathogens, and should not be used as the sole basis for treatment or other patient management decisions. Negative results must be combined with clinical observations, patient history, and epidemiological information. The expected result is Negative.  Fact Sheet for Patients: HairSlick.no  Fact Sheet for Healthcare Providers: quierodirigir.com  This test is not yet approved or cleared by the [place] FDA and  has been authorized for detection and/or diagnosis of SARS-CoV-2 by FDA under an Emergency Use Authorization (EUA). This EUA will remain  in effect (meaning this test can be used) for the duration of the COVID-19 declaration under Se ction 564(b)(1) of the Act, 21 U.S.C. section 360bbb-3(b)(1), unless the authorization is terminated or revoked sooner.  Performed at [facility], [address], [place], [place] [address]           Radiology Studies: No results found.      Scheduled Meds:  atorvastatin  40 mg Oral Daily   chlordiazePOXIDE  25 mg Oral QHS   empagliflozin  10 mg Oral Daily   enoxaparin (LOVENOX) injection  40 mg Subcutaneous Q24H   lactulose  30 g Oral Daily   lidocaine  1 patch Transdermal Q24H   metoprolol succinate  100 mg Oral BID   naltrexone  25 mg Oral Daily   Followed by   Melene Muller ON 09/20/2020] naltrexone  50 mg Oral Daily   pantoprazole  40 mg Oral Daily   polyethylene glycol  17 g Oral TID   sacubitril-valsartan  1 tablet Oral BID   senna-docusate  2 tablet Oral QHS   sodium chloride flush  3 mL Intravenous Q12H   spironolactone  25 mg Oral Daily   [START ON 09/23/2020] thiamine  100 mg Oral Daily   Continuous Infusions:  sodium chloride Stopped (09/15/20 1548)   thiamine injection 250 mg (09/18/20 1310)     LOS: 6 days    Time spent: over 30 min    Lacretia Nicks, MD [facility]   To contact the attending provider between 7A-7P or the covering provider during after hours 7P-7A, please log into the web site [email or web address] and access using universal [place] password for that web site. If you do not have the password, please call the hospital operator.  09/18/2020, 4:54 PM

## 2020-09-18 NOTE — Consult Note (Signed)
Bruce Moreno is a 50 y.o. male with a history of alcohol, cocaine and tobacco abuse, chronic HFrEF, HTN, HLD, and substance-induced mood disorder who presented to the ED 8/22 with severe left chest pain. ECG and cardiac enzymes were reassuring, though he was severely hypertensive and reporting desire to detox from alcohol. Started on CIWA, BP medications augmented, and he was admitted.   Psych consult placed for depression, anxiety in the setting of etoh abuse- recent admission at [facility]  for suicidal ideations.   Patient seen and briefly assessed as he immediately denied any depressive symptoms or suicidal ideations. He reports recent admission at [facility] for suicidal ideations, however he left AMA. He reports compliance with the medications and would like to be restarted on Seroquel. He reports a history of substance abuse to include cocaine and marijuana. He denies any suicidal ideations or recent suicide attempts. He does appear to be forward thinking about outpatient behavioral health resources, and inpatient rehabilitation.   Will resume home medications at this time.  -Recommend TOC consult for inpatient rehab for substance abuse. If patient does not qualify for inpatient rehab, may consider substance abuse intensive outpatient(SAIOP).  -Psychiatry to sign off at this time

## 2020-09-18 NOTE — Social Work (Signed)
CSW spoke with pt via phone and as if he would be amenable to receiving SA resources. CSW explained that the list was for agencies that provide treatment for SA.  Pt was agreeable to receiving resources. CSW attempted to provide the resources however pt was sleep therefore CSW left the resources and let Nurse tech to let him know that the resources were provided.

## 2020-09-19 ENCOUNTER — Inpatient Hospital Stay (HOSPITAL_COMMUNITY): Payer: Medicaid Other

## 2020-09-19 DIAGNOSIS — R609 Edema, unspecified: Secondary | ICD-10-CM | POA: Diagnosis not present

## 2020-09-19 DIAGNOSIS — R079 Chest pain, unspecified: Secondary | ICD-10-CM | POA: Diagnosis not present

## 2020-09-19 MED ORDER — DOXYCYCLINE HYCLATE 100 MG PO TABS
100.0000 mg | ORAL_TABLET | Freq: Two times a day (BID) | ORAL | Status: DC
  Administered 2020-09-19 – 2020-09-20 (×3): 100 mg via ORAL
  Filled 2020-09-19 (×3): qty 1

## 2020-09-19 MED ORDER — DICLOFENAC SODIUM 1 % EX GEL
2.0000 g | Freq: Four times a day (QID) | CUTANEOUS | Status: DC | PRN
  Administered 2020-09-19: 2 g via TOPICAL
  Filled 2020-09-19: qty 100

## 2020-09-19 MED ORDER — KETOROLAC TROMETHAMINE 15 MG/ML IJ SOLN: 15.0000 mg | Freq: Four times a day (QID) | INTRAMUSCULAR | Status: DC | PRN

## 2020-09-19 NOTE — Progress Notes (Signed)
Pt pain 10/10 from right arm. Lab draw made arm hurt. Pt withdraw from pain and arm warm to touch. Notified provider.

## 2020-09-19 NOTE — Progress Notes (Signed)
Physical Therapy Treatment Patient Details Name: Bruce Moreno MRN: [id number] DOB: 10-Feb-1970 Today's Date: 09/19/2020    History of Present Illness 50 y.o. male  Presented to ED with chest pain and left lateral pain shooting up his L arm and alcohol detox. . In ED, BP 181/112, ekg NSR with rate of 93, prolonged qt. CIWA score of 11. protocol started. Admitted 8/22 for treatment of chest pain in presence of alcohol and cocaine abuse. PMH: alcohol abuse, cocaine abuse, HTN, CHF, HLD, tobacco abuse    PT Comments    Pt returning from procedure via wheelchair and able to get up with min guard. Pt reluctantly agreeable to walk with therapy prior to getting settled. Pt continues to be limited in safe mobility by decreased balance especially with fatigue. Pt is min guard for majority of ambulation but has 2x LoB requiring assist to steady. Pt reports he is worried about his balance. Encouraged pt mobilize multiple times throughout the day at home to improve his strength, balance and endurance. PT also recommends Outpatient PT to work on balance at discharge.      Follow Up Recommendations  Other (comment);Outpatient PT (return to Cardiac Rehab)     Equipment Recommendations  None recommended by PT       Precautions / Restrictions Precautions Precautions: Fall Restrictions Weight Bearing Restrictions: No    Mobility  Bed Mobility                    Transfers Overall transfer level: Needs assistance   Transfers: Sit to/from Stand Sit to Stand: Min guard         General transfer comment: met transport in room with pt, able to get out of wheelchair with min guard  Ambulation/Gait Ambulation/Gait assistance: Min assist;Max assist Gait Distance (Feet): 350 Feet Assistive device: None Gait Pattern/deviations: Step-through pattern;Decreased step length - right;Decreased step length - left;Drifts right/left;Shuffle Gait velocity: slowed Gait velocity interpretation: 1.31 -  2.62 ft/sec, indicative of limited community ambulator General Gait Details: generally contact guard assist however experiences 2x LoB requiring maxA from therapist to steady, tried to progress ambulation distance however pt requires seated rest break due to "be wore out"         Balance Overall balance assessment: Needs assistance Sitting-balance support: No upper extremity supported;Feet supported Sitting balance-Leahy Scale: Fair     Standing balance support: No upper extremity supported Standing balance-Leahy Scale: Fair                              Cognition Arousal/Alertness: Lethargic Behavior During Therapy: Flat affect;Restless Overall Cognitive Status: Impaired/Different from baseline Area of Impairment: Attention;Following commands;Safety/judgement;Awareness;Problem solving                 Orientation Level: Disoriented to;Place;Time Current Attention Level: Divided (preoccupied with R arm pain and possible DVT)   Following Commands: Follows multi-step commands with increased time Safety/Judgement: Decreased awareness of safety;Decreased awareness of deficits Awareness: Emergent Problem Solving: Slow processing;Decreased initiation;Requires verbal cues;Requires tactile cues General Comments: pt processing continues to be slowed and has poor insight into how muscle fatigue can cause decreased balance         General Comments General comments (skin integrity, edema, etc.): max HR noted 100bpm,      Pertinent Vitals/Pain Pain Assessment: Faces Faces Pain Scale: Hurts little more Pain Location: R forearm, slight chest pain with walking Pain Descriptors / Indicators: Aching;Sore;Tightness;Throbbing  PT Goals (current goals can now be found in the care plan section) Acute Rehab PT Goals Patient Stated Goal: stop drinking PT Goal Formulation: With patient Time For Goal Achievement: 09/30/20 Potential to Achieve Goals: Fair Progress towards  PT goals: Progressing toward goals    Frequency    Min 3X/week      PT Plan Discharge plan needs to be updated       AM-PAC PT "6 Clicks" Mobility   Outcome Measure  Help needed turning from your back to your side while in a flat bed without using bedrails?: None Help needed moving from lying on your back to sitting on the side of a flat bed without using bedrails?: None Help needed moving to and from a bed to a chair (including a wheelchair)?: A Little Help needed standing up from a chair using your arms (e.g., wheelchair or bedside chair)?: A Little Help needed to walk in hospital room?: A Lot Help needed climbing 3-5 steps with a railing? : A Lot 6 Click Score: 18    End of Session Equipment Utilized During Treatment: Gait belt Activity Tolerance: Patient limited by lethargy Patient left: with call bell/phone within reach;in chair Nurse Communication: Mobility status PT Visit Diagnosis: Unsteadiness on feet (R26.81);Other abnormalities of gait and mobility (R26.89);Muscle weakness (generalized) (M62.81);History of falling (Z91.81)     Time: 4268-3419 PT Time Calculation (min) (ACUTE ONLY): 21 min  Charges:  $Gait Training: 8-22 mins                     Eryc Bodey B. Migdalia Dk PT, DPT Acute Rehabilitation Services Pager [phone] Office [phone]    [place] 09/19/2020, 2:17 PM

## 2020-09-19 NOTE — Progress Notes (Signed)
RUE venous duplex has been completed.  Preliminary findings messaged to [place], RN and Dr. Lowell Guitar.  Results can be found under chart review under CV PROC. 09/19/2020 2:22 PM Isaac Lacson RVT, RDMS

## 2020-09-19 NOTE — Progress Notes (Signed)
PROGRESS NOTE    Bruce Moreno  MWN:[id number] DOB: 1970-06-04 DOA: 09/12/2020 PCP: Maryelizabeth Rowan, MD   No chief complaint on file.  Brief Narrative:  Bruce Moreno is Bruce Moreno 50 y.o. male with Bruce Moreno history of alcohol, cocaine and tobacco abuse, chronic HFrEF, HTN, HLD, and substance-induced mood disorder who presented to the ED 8/22 with severe left chest pain. ECG and cardiac enzymes were reassuring, though he was severely hypertensive and reporting desire to detox from alcohol. Started on CIWA, BP medications augmented, and he was admitted.  Assessment & Plan:   Principal Problem:   Chest pain on exertion Active Problems:   Alcohol use disorder, severe, dependence (HCC)   Cocaine abuse (HCC)   HFrEF (heart failure with reduced ejection fraction) (HCC)   HTN (hypertension)   Pure hypercholesterolemia   Chest pain  Superfical Venous Thrombosis No evidence DVT on [place] L cephalic superficial vein thrombosis noted on 8/26 R basilic age indeterminate superficial vein thrombosis noted today Will give doxycycline for R arm swelling/tenderness Afebrile Symptomatic management   Depression  Anxiety  Etoh Use Disorder Recent hospitalization at [facility] for SI Denies SI at this time Not on any meds at this time for mood sx -> but seems to be significantly anxious with depressive symptoms - likely related to withdrawal (treating as below) Will c/s psych for recs as well - as below, recommending seroquel   Nausea  Vomiting Exam not particularly concerning - lipase wnl, LFT's with mildly elevated ALT KUB notes stool burden Seems improved today Will treat constipation aggressively, if not improving, consider additional imaging with CT  Alcohol abuse and withdrawal: States he drinks brown liquor daily, usually 1/2 gallon. Last drink 4am on 8/22. Began drinking at age 14, has been off and on, heavily drinking since Feb 2022 when his nephew, whom he raised, died.  - Continue CIWA - fluctuating.   He's high risk for complicated withdrawal - librium taper. - desires abstinence, discussed meds - he's open to naltrexone, started this - TOC consulted, will need resources and psychosocial support.   Chest pain: Cardiac enzymes have been reassuring. Resolved. Treadmill stress test Jul 2022 showed no ST changes or arrhythmias. LHC and RHC April 2022 showed no significant coronary disease, normal filling pressures, and low-normal cardiac output. - Continue monitoring with cardiac medications continued as below.    Chronic combined HFrEF:  - Continue home metoprolol, entresto, spironolactone, empagliflozin. Can continue prn lasix, though does not appear volume overloaded.  - Monitor I/O, weights.  - Follow up with cardiology as outpatient.   HTN urgency: Remains severely elevated - Add clonidine as adjunct with alcohol withdrawal as well - BP improved, follow - taper off clonidine - Continue home medications as above.   HLD:  - Continue statin   Cocaine use: +UDS 3 weeks ago but only +THC at admission here.  - Cessation counseling reviewed.    QT prolongation:  - Monitor on telemetry, monitor electrolytes, avoid provocative agents.  - repeat EKG 8/26 439   Substance-induced mood disorder: Notes reviewed from recent hospitalization at [facility] for SI. No current complaints or reports of thoughts of self harm. - appreciate psychiatry recs - recommending restarting seroquel (discussed dosing and recommended 50 mg) (EKG from 8/26 reviewed, normal Qtc)   Cervical disc disease with left radiculopathy: Stable symptoms per patient.  - Trial flexeril prn - Follow up with outpatient provider at [facility] for ongoing injections.    Constipation:  - Start scheduled bowel regimen.  Obesity: Estimated body mass index is 31.67 kg/m as calculated from the following:  DVT prophylaxis: lovenox Code Status: full  Family Communication: none at bedside Disposition:   Status is: Inpatient  Remains  inpatient appropriate because:Inpatient level of care appropriate due to severity of illness  Dispo: The patient is from: Home              Anticipated d/c is to: Home              Patient currently is not medically stable to d/c.   Difficult to place patient No       Consultants:  none  Procedures:  none  Antimicrobials:  Anti-infectives (From admission, onward)    Start     Dose/Rate Route Frequency Ordered Stop   09/19/20 1315  doxycycline (VIBRA-TABS) tablet 100 mg        100 mg Oral Every 12 hours 09/19/20 1229            Subjective: C/o R arm pain  Objective: Vitals:   09/18/20 1357 09/18/20 2019 09/19/20 0635 09/19/20 1510  BP: 124/84 116/71 110/65 110/83  Pulse: 79 95 79 75  Resp: 17 16 19 20   Temp: 97.9 F (36.6 C) 98.3 F (36.8 C) 97.9 F (36.6 C) 98.2 F (36.8 C)  TempSrc: Oral Oral Oral Oral  SpO2: 100%     Weight:   87.3 kg   Height:       No intake or output data in the 24 hours ending 09/19/20 1735  Filed Weights   09/16/20 0918 09/18/20 0500 09/19/20 0635  Weight: 87.6 kg 87.1 kg 87.3 kg    Examination:  General: No acute distress. Cardiovascular: Heart sounds show Bruce Moreno regular rate, and rhythm.  Lungs: Clear to auscultation bilaterally Abdomen: Soft, nontender, nondistended  Neurological: Alert and oriented 3. Moves all extremities 4 with equal strength. Cranial nerves II through XII grossly intact. Skin: Warm and dry. No rashes or lesions. Extremities: RUE with swelling in R arm, palpable cord, tender   Data Reviewed: I have personally reviewed following labs and imaging studies  CBC: Recent Labs  Lab 09/14/20 1924 09/15/20 0308 09/16/20 0144 09/17/20 0320  WBC 6.9 6.5 6.6 7.6  NEUTROABS  --  3.8 3.4 4.4  HGB 14.4 13.1 13.3 12.4*  HCT 42.1 38.8* 39.4 37.9*  MCV 91.7 93.0 93.1 95.2  PLT 221 215 203 185    Basic Metabolic Panel: Recent Labs  Lab 09/12/20 2131 09/14/20 0205 09/14/20 1924 09/15/20 0308  09/16/20 0144 09/17/20 0320  NA  --  135 136 135 136 137  K  --  4.3 4.2 3.8 3.9 4.1  CL  --  103 104 104 106 105  CO2  --  24 24 25 24 24   GLUCOSE  --  102* 129* 125* 108* 131*  BUN  --  8 12 12 12 11   CREATININE  --  0.96 1.14 1.00 1.13 1.12  CALCIUM  --  9.6 9.5 9.5 9.4 9.5  MG 1.9  --  2.2 2.1 2.1 2.0  PHOS  --   --  4.1 4.3 4.0 3.5    GFR: Estimated Creatinine Clearance: 80.8 mL/min (by C-G formula based on SCr of 1.12 mg/dL).  Liver Function Tests: Recent Labs  Lab 09/14/20 0205 09/14/20 1924 09/15/20 0308 09/16/20 0144 09/17/20 0320  AST 22 27 25  32 27  ALT 33 37 36 47* 50*  ALKPHOS 71 74 64 68 69  BILITOT 0.5 0.5 0.5 0.6  0.5  PROT 7.4 7.5 7.0 7.1 6.7  ALBUMIN 3.5 3.6 3.4* 3.5 3.3*    CBG: No results for input(s): GLUCAP in the last 168 hours.   Recent Results (from the past 240 hour(s))  SARS CORONAVIRUS 2 (TAT 6-24 HRS) Nasopharyngeal Nasopharyngeal Swab     Status: None   Collection Time: 09/12/20  3:45 PM   Specimen: Nasopharyngeal Swab  Result Value Ref Range Status   SARS Coronavirus 2 NEGATIVE NEGATIVE Final    Comment: (NOTE) SARS-CoV-2 target nucleic acids are NOT DETECTED.  The SARS-CoV-2 RNA is generally detectable in upper and lower respiratory specimens during the acute phase of infection. Negative results do not preclude SARS-CoV-2 infection, do not rule out co-infections with other pathogens, and should not be used as the sole basis for treatment or other patient management decisions. Negative results must be combined with clinical observations, patient history, and epidemiological information. The expected result is Negative.  Fact Sheet for Patients: HairSlick.nohttps://www.fda.gov/media/138098/download  Fact Sheet for Healthcare Providers: quierodirigir.comhttps://www.fda.gov/media/138095/download  This test is not yet approved or cleared by the [place] FDA and  has been authorized for detection and/or diagnosis of SARS-CoV-2 by FDA under an Emergency  Use Authorization (EUA). This EUA will remain  in effect (meaning this test can be used) for the duration of the COVID-19 declaration under Se ction 564(b)(1) of the Act, 21 U.S.C. section 360bbb-3(b)(1), unless the authorization is terminated or revoked sooner.  Performed at [facility], [address] [place], [place] [address]          Radiology Studies: VAS US UPPER EXTREMITY VENOUS DUPLEX  Result Date: 09/19/2020 UPPER VENOUS STUDY  Patient Name:  Bruce Moreno  Date of Exam:   09/19/2020 Medical Rec #: [id number]      Accession #:    [id number] Date of Birth: 07/01/1970      Patient Gender: M Patient Age:   89 years [facility]  Procedure:      VAS US UPPER EXTREMITY VENOUS DUPLEX Referring Phys: Bruce Moreno --------------------------------------------------------------------------------  Indications: Edema Comparison Study: Previous exam (LUE) showed SVT of cephalic on 09/16/20 Performing Technologist: Ernestene MentionJody Hill RVT, RDMS  Examination Guidelines: Kennice Finnie complete evaluation includes B-mode imaging, spectral Doppler, color Doppler, and power Doppler as needed of all accessible portions of each vessel. Bilateral testing is considered an integral part of Kalene Cutler complete examination. Limited examinations for reoccurring indications may be performed as noted.  Right Findings: +----------+------------+---------+-----------+----------+---------------------+ RIGHT     CompressiblePhasicitySpontaneousProperties       Summary        +----------+------------+---------+-----------+----------+---------------------+ IJV           Full       Yes       Yes                                    +----------+------------+---------+-----------+----------+---------------------+ Subclavian    Full       Yes       Yes                                    +----------+------------+---------+-----------+----------+---------------------+ Axillary      Full       Yes       Yes                                     +----------+------------+---------+-----------+----------+---------------------+  Brachial      Full       Yes       Yes                                    +----------+------------+---------+-----------+----------+---------------------+ Radial        Full                                                        +----------+------------+---------+-----------+----------+---------------------+ Ulnar         Full                                                        +----------+------------+---------+-----------+----------+---------------------+ Cephalic      Full                                                        +----------+------------+---------+-----------+----------+---------------------+ Basilic       None       No        No               Age indeterminate in                                                             forearm        +----------+------------+---------+-----------+----------+---------------------+  Left Findings: +----------+------------+---------+-----------+----------+-------+ LEFT      CompressiblePhasicitySpontaneousPropertiesSummary +----------+------------+---------+-----------+----------+-------+ Subclavian    Full       Yes       Yes                      +----------+------------+---------+-----------+----------+-------+  Summary:  Right: No evidence of deep vein thrombosis in the upper extremity. Findings consistent with age indeterminate superficial vein thrombosis involving the right basilic vein at the level of the forearm.  Left: No evidence of thrombosis in the subclavian.  *See table(s) above for measurements and observations.  Diagnosing physician: Lemar Livings MD Electronically signed by Lemar Livings MD on 09/19/2020 at 5:34:13 PM.    Final         Scheduled Meds:  atorvastatin  40 mg Oral Daily   doxycycline  100 mg Oral Q12H   empagliflozin  10 mg Oral Daily   enoxaparin (LOVENOX)  injection  40 mg Subcutaneous Q24H   lactulose  30 g Oral Daily   lidocaine  1 patch Transdermal Q24H   metoprolol succinate  100 mg Oral BID   [START ON 09/20/2020] naltrexone  50 mg Oral Daily   pantoprazole  40 mg Oral Daily   polyethylene glycol  17 g Oral QHS   QUEtiapine  50 mg Oral QHS   sacubitril-valsartan  1 tablet Oral BID   senna-docusate  2 tablet Oral QHS   sodium chloride flush  3 mL Intravenous Q12H   spironolactone  25 mg Oral Daily   [START ON 09/23/2020] thiamine  100 mg Oral Daily   Continuous Infusions:  sodium chloride Stopped (09/15/20 1548)   thiamine injection 250 mg (09/19/20 1657)     LOS: 7 days    Time spent: over 30 min    Lacretia Nicks, MD [facility]   To contact the attending provider between 7A-7P or the covering provider during after hours 7P-7A, please log into the web site [email or web address] and access using universal [place] password for that web site. If you do not have the password, please call the hospital operator.  09/19/2020, 5:35 PM

## 2020-09-20 ENCOUNTER — Encounter (HOSPITAL_COMMUNITY): Payer: Medicaid Other

## 2020-09-20 LAB — COMPREHENSIVE METABOLIC PANEL
ALT: 40 U/L (ref 0–44)
AST: 25 U/L (ref 15–41)
Albumin: 3.4 g/dL — ABNORMAL LOW (ref 3.5–5.0)
Alkaline Phosphatase: 69 U/L (ref 38–126)
Anion gap: 8 (ref 5–15)
BUN: 12 mg/dL (ref 6–20)
CO2: 25 mmol/L (ref 22–32)
Calcium: 9.5 mg/dL (ref 8.9–10.3)
Chloride: 103 mmol/L (ref 98–111)
Creatinine, Ser: 1.19 mg/dL (ref 0.61–1.24)
GFR, Estimated: >60 mL/min (ref >60)
Glucose, Bld: 141 mg/dL — ABNORMAL HIGH (ref 70–99)
Potassium: 4.3 mmol/L (ref 3.5–5.1)
Sodium: 136 mmol/L (ref 135–145)
Total Bilirubin: 0.5 mg/dL (ref 0.3–1.2)
Total Protein: 7.2 g/dL (ref 6.5–8.1)

## 2020-09-20 LAB — CBC WITH DIFFERENTIAL/PLATELET
Abs Immature Granulocytes: 0.02 10*3/uL (ref 0.00–0.07)
Basophils Absolute: 0 10*3/uL (ref 0.0–0.1)
Basophils Relative: 1 %
Eosinophils Absolute: 0.1 10*3/uL (ref 0.0–0.5)
Eosinophils Relative: 2 %
HCT: 40 % (ref 39.0–52.0)
Hemoglobin: 13 g/dL (ref 13.0–17.0)
Immature Granulocytes: 0 %
Lymphocytes Relative: 43 %
Lymphs Abs: 2.6 10*3/uL (ref 0.7–4.0)
MCH: 31.3 pg (ref 26.0–34.0)
MCHC: 32.5 g/dL (ref 30.0–36.0)
MCV: 96.2 fL (ref 80.0–100.0)
Monocytes Absolute: 0.7 10*3/uL (ref 0.1–1.0)
Monocytes Relative: 12 %
Neutro Abs: 2.5 10*3/uL (ref 1.7–7.7)
Neutrophils Relative %: 42 %
Platelets: 173 10*3/uL (ref 150–400)
RBC: 4.16 MIL/uL — ABNORMAL LOW (ref 4.22–5.81)
RDW: 13.8 % (ref 11.5–15.5)
WBC: 6 10*3/uL (ref 4.0–10.5)
nRBC: 0 % (ref 0.0–0.2)

## 2020-09-20 LAB — PHOSPHORUS: Phosphorus: 4.9 mg/dL — ABNORMAL HIGH (ref 2.5–4.6)

## 2020-09-20 LAB — MAGNESIUM: Magnesium: 2.2 mg/dL (ref 1.7–2.4)

## 2020-09-20 MED ORDER — KETOROLAC TROMETHAMINE 15 MG/ML IJ SOLN
15.0000 mg | Freq: Four times a day (QID) | INTRAMUSCULAR | Status: DC
  Administered 2020-09-20: 15 mg via INTRAVENOUS
  Filled 2020-09-20: qty 1

## 2020-09-20 MED ORDER — CEPHALEXIN 500 MG PO CAPS: 500.0000 mg | ORAL_CAPSULE | Freq: Four times a day (QID) | ORAL | Status: DC

## 2020-09-20 MED ORDER — CEPHALEXIN 500 MG PO CAPS
500.0000 mg | ORAL_CAPSULE | Freq: Four times a day (QID) | ORAL | Status: DC
  Administered 2020-09-20: 500 mg via ORAL
  Filled 2020-09-20: qty 1

## 2020-09-20 MED ORDER — AMOXICILLIN-POT CLAVULANATE 875-125 MG PO TABS: 1.0000 | ORAL_TABLET | Freq: Two times a day (BID) | ORAL | Status: DC

## 2020-09-20 NOTE — Discharge Summary (Signed)
Physician Discharge Summary  Bruce Moreno NIO:[id number] DOB: [birth year] DOA: 09/12/2020  PCP: Maryelizabeth Rowan, MD  Admit date: 09/12/2020 Discharge date: 09/20/2020  Time spent: 40 minutes  Recommendations for Outpatient Follow-up:  Patient left AMA See below for details of hospitalization   Discharge Diagnoses:  Principal Problem:   Chest pain on exertion Active Problems:   Alcohol use disorder, severe, dependence (HCC)   Cocaine abuse (HCC)   HFrEF (heart failure with reduced ejection fraction) (HCC)   HTN (hypertension)   Pure hypercholesterolemia   Chest pain   Discharge Condition: stable  Diet recommendation: heart healthy  Filed Weights   09/18/20 0500 09/19/20 0635 09/20/20 0523  Weight: 87.1 kg 87.3 kg 87.7 kg    History of present illness:  Bruce Moreno is Bruce Moreno 50 y.o. male with Ayham Word history of alcohol, cocaine and tobacco abuse, chronic HFrEF, HTN, HLD, and substance-induced mood disorder who presented to the ED 8/22 with severe left chest pain. ECG and cardiac enzymes were reassuring, though he was severely hypertensive and reporting desire to detox from alcohol. Started on CIWA, BP medications augmented, and he was admitted.  He was treated for alcohol withdrawal.  Hospitalization complicated by superficial venous thrombosis to bilateral upper extremities.  Worse on right.  Plan was for repeat US today and likely d/c pending results, but he left AMA before we were able to follow up his ultrasound.  Needs follow up for his alcohol use disorder.    See below for additional details  Hospital Course:  Superfical Venous Thrombosis No evidence DVT on [place] L cephalic superficial vein thrombosis noted on 8/26 R basilic age indeterminate superficial vein thrombosis noted 8/29 Repeat US ordered 8/30 due to reported worsening pain on RUE, but unfortunately left before his [place] was complete Abx to doxy/keflex - left AMA Afebrile Symptomatic management    Depression   Anxiety  Etoh Use Disorder Recent hospitalization at [facility] for SI Denies SI at this time Not on any meds at this time for mood sx -> but seems to be significantly anxious with depressive symptoms - likely related to withdrawal (treating as below) Will c/s psych for recs as well - as below, recommending seroquel - he left AMA   Nausea  Vomiting Exam not particularly concerning - lipase wnl, LFT's with mildly elevated ALT KUB notes stool burden Seems improved  Will treat constipation aggressively, if not improving, consider additional imaging with CT   Alcohol abuse and withdrawal: States he drinks brown liquor daily, usually 1/2 gallon. Last drink 4am on 8/22. Began drinking at age 10, has been off and on, heavily drinking since Feb 2022 when his nephew, whom he raised, died.  - Continue CIWA - fluctuating.  He's high risk for complicated withdrawal - librium taper. - desires abstinence, discussed meds - he's open to naltrexone, started this in house, can consider this or acamprosate outpatient - but today when we discussed it, he didn't recall what it was for - limited health literacy, I think he'll need support from family - TOC consulted, will need resources and psychosocial support.   Chest pain: Cardiac enzymes have been reassuring. Resolved. Treadmill stress test Jul 2022 showed no ST changes or arrhythmias. LHC and RHC April 2022 showed no significant coronary disease, normal filling pressures, and low-normal cardiac output. - Continue monitoring with cardiac medications continued as below.    Chronic combined HFrEF:  - Continue home metoprolol, entresto, spironolactone, empagliflozin. Can continue prn lasix, though does not appear volume overloaded.  -  Monitor I/O, weights.  - Follow up with cardiology as outpatient.   HTN urgency: Remains severely elevated - Add clonidine as adjunct with alcohol withdrawal as well - BP improved, follow - taper off clonidine - Continue home  medications as above.   HLD:  - Continue statin   Cocaine use: +UDS 3 weeks ago but only +THC at admission here.  - Cessation counseling reviewed.    QT prolongation:  - Monitor on telemetry, monitor electrolytes, avoid provocative agents.  - repeat EKG 8/26 439   Substance-induced mood disorder: Notes reviewed from recent hospitalization at [facility] for SI. No current complaints or reports of thoughts of self harm. - appreciate psychiatry recs - recommending restarting seroquel (discussed dosing and recommended 50 mg) (EKG from 8/26 reviewed, normal Qtc)   Cervical disc disease with left radiculopathy: Stable symptoms per patient.  - Trial flexeril prn - Follow up with outpatient provider at [facility] for ongoing injections.    Constipation:  - Start scheduled bowel regimen.   Obesity: Estimated body mass index is 31.67 kg/m as calculated from the following:  Procedures: RUE [place] Summary:     Right:  No evidence of deep vein thrombosis in the upper extremity. Findings  consistent  with age indeterminate superficial vein thrombosis involving the right  basilic  vein at the level of the forearm.     Left:  No evidence of thrombosis in the subclavian.   Summary:     Left:  No evidence of deep vein thrombosis in the upper extremity. Findings  consistent  with age indeterminate superficial vein thrombosis involving the left  cephalic  vein at the level of the antecubital fossa.   Consultations: psychiatry  Discharge Exam: Vitals:   09/20/20 0523 09/20/20 0846  BP: 99/83 (!) 121/91  Pulse: 78 76  Resp: 19 20  Temp: 98 F (36.7 C) 98.7 F (37.1 C)  SpO2: 98% 97%   C/o worsening RUE pain Discussed plan for repeat US, abx, pain meds -> likely d/c today  He left AMA unfortunately Called brother to let him know (I couldn't reach pt on phone)  General: No acute distress. Cardiovascular: Heart sounds show Alyan Hartline regular rate, and rhythm.  Lungs: Clear to auscultation  bilaterally  Abdomen: Soft, nontender, nondistended Neurological: Alert and oriented 3. Moves all extremities 4 . Cranial nerves II through XII grossly intact. Skin: Warm and dry. No rashes or lesions. Extremities: palpable cord to RUE   Discharge Instructions    Allergies as of 09/20/2020   No Known Allergies    No Known Allergies    The results of significant diagnostics from this hospitalization (including imaging, microbiology, ancillary and laboratory) are listed below for reference.    Significant Diagnostic Studies: DG Chest 2 View  Result Date: 09/12/2020 CLINICAL DATA:  Chest pain EXAM: CHEST - 2 VIEW COMPARISON:  08/23/2020 FINDINGS: Heart size is normal. Mild tortuosity of the aorta. The lungs are clear. No effusions. No significant bone finding. IMPRESSION: No active cardiopulmonary disease. Electronically Signed   By: Paulina Fusi M.D.   On: 09/12/2020 11:42   DG Chest 2 View  Result Date: 08/23/2020 CLINICAL DATA:  Suicidal thoughts. EXAM: CHEST - 2 VIEW COMPARISON:  None. FINDINGS: The lungs are mildly hyperinflated. There is no evidence of acute infiltrate, pleural effusion or pneumothorax. The cardiac silhouette is borderline in size. The visualized skeletal structures are unremarkable. IMPRESSION: No active cardiopulmonary disease. Electronically Signed   By: Aram Candela M.D.   On: 08/23/2020  19:24   DG Abd 1 View  Result Date: 09/16/2020 CLINICAL DATA:  Nausea and vomiting. EXAM: ABDOMEN - 1 VIEW COMPARISON:  None. FINDINGS: Nonobstructive bowel gas pattern. Moderate to large amount of stool in the abdomen. No large abdominal or pelvic calcifications. Visualized bone structures are unremarkable. IMPRESSION: 1. Nonobstructive bowel gas pattern. 2. Moderate to large amount of stool in the abdomen. Electronically Signed   By: Richarda OverlieAdam  Henn M.D.   On: 09/16/2020 13:58   VAS US UPPER EXTREMITY VENOUS DUPLEX  Result Date: 09/19/2020 UPPER VENOUS STUDY  [facility]  Date of Exam:   09/19/2020 Medical Rec #: [id number]      Accession #:    [id number] Date of Birth: 12/24/1970      Patient Gender: M Patient Age:   [age] years [facility] Procedure:      VAS US UPPER EXTREMITY VENOUS DUPLEX Referring Phys: Donovin Kraemer POWELL JR --------------------------------------------------------------------------------  Indications: Edema Comparison Study: Previous exam (LUE) showed SVT of cephalic on 09/16/20 Performing Technologist: Ernestene MentionJody Hill RVT, RDMS  Examination Guidelines: Samyak Sackmann complete evaluation includes B-mode imaging, spectral Doppler, color Doppler, and power Doppler as needed of all accessible portions of each vessel. Bilateral testing is considered an integral part of Dartanyan Deasis complete examination. Limited examinations for reoccurring indications may be performed as noted.  Right Findings: +----------+------------+---------+-----------+----------+---------------------+ RIGHT     CompressiblePhasicitySpontaneousProperties       Summary        +----------+------------+---------+-----------+----------+---------------------+ IJV           Full       Yes       Yes                                    +----------+------------+---------+-----------+----------+---------------------+ Subclavian    Full       Yes       Yes                                    +----------+------------+---------+-----------+----------+---------------------+ Axillary      Full       Yes       Yes                                    +----------+------------+---------+-----------+----------+---------------------+ Brachial      Full       Yes       Yes                                    +----------+------------+---------+-----------+----------+---------------------+ Radial        Full                                                        +----------+------------+---------+-----------+----------+---------------------+ Ulnar         Full                                                         +----------+------------+---------+-----------+----------+---------------------+  Cephalic      Full                                                        +----------+------------+---------+-----------+----------+---------------------+ Basilic       None       No        No               Age indeterminate in                                                             forearm        +----------+------------+---------+-----------+----------+---------------------+  Left Findings: +----------+------------+---------+-----------+----------+-------+ LEFT      CompressiblePhasicitySpontaneousPropertiesSummary +----------+------------+---------+-----------+----------+-------+ Subclavian    Full       Yes       Yes                      +----------+------------+---------+-----------+----------+-------+  Summary:  Right: No evidence of deep vein thrombosis in the upper extremity. Findings consistent with age indeterminate superficial vein thrombosis involving the right basilic vein at the level of the forearm.  Left: No evidence of thrombosis in the subclavian.  *See table(s) above for measurements and observations.  Diagnosing physician: Lemar Livings MD Electronically signed by Lemar Livings MD on 09/19/2020 at 5:34:13 PM.    Final    VAS [place] UPPER EXTREMITY VENOUS DUPLEX  Result Date: 09/16/2020 UPPER VENOUS STUDY  Patient Name:  PUR[facility]  Date of Exam:   09/16/2020 Medical Rec #: [id number]      Accession #:    [id number] Date of Birth: Jul 03, 1970      Patient Gender: M Patient Age:   27 years Exam Location:  [facility] Procedure:      VAS [place] UPPER EXTREMITY VENOUS DUPLEX Referring Phys: Michela Herst POWELL JR --------------------------------------------------------------------------------  Indications: Pain Comparison Study: No prior studies. Performing Technologist: Jean Rosenthal RDMS, RVT  Examination Guidelines: Laurali Goddard complete evaluation includes B-mode  imaging, spectral Doppler, color Doppler, and power Doppler as needed of all accessible portions of each vessel. Bilateral testing is considered an integral part of Naithan Delage complete examination. Limited examinations for reoccurring indications may be performed as noted.  Left Findings: +----------+------------+---------+-----------+----------+-----------------+ LEFT      CompressiblePhasicitySpontaneousProperties     Summary      +----------+------------+---------+-----------+----------+-----------------+ IJV           Full       Yes       Yes                                +----------+------------+---------+-----------+----------+-----------------+ Subclavian               Yes       Yes                                +----------+------------+---------+-----------+----------+-----------------+ Axillary      Full       Yes       Yes                                +----------+------------+---------+-----------+----------+-----------------+  Brachial      Full                                                    +----------+------------+---------+-----------+----------+-----------------+ Radial        Full                                                    +----------+------------+---------+-----------+----------+-----------------+ Ulnar         Full                                                    +----------+------------+---------+-----------+----------+-----------------+ Cephalic    Partial      Yes       Yes              Age Indeterminate +----------+------------+---------+-----------+----------+-----------------+ Basilic       Full                                                    +----------+------------+---------+-----------+----------+-----------------+  Summary:  Left: No evidence of deep vein thrombosis in the upper extremity. Findings consistent with age indeterminate superficial vein thrombosis involving the left cephalic vein at the level of the  antecubital fossa.  *See table(s) above for measurements and observations.  Diagnosing physician: Sherald Hess MD Electronically signed by Sherald Hess MD on 09/16/2020 at 6:02:54 PM.    Final     Microbiology: Recent Results (from the past 240 hour(s))  SARS CORONAVIRUS 2 (TAT 6-24 HRS) Nasopharyngeal Nasopharyngeal Swab     Status: None   Collection Time: 09/12/20  3:45 PM   Specimen: Nasopharyngeal Swab  Result Value Ref Range Status   SARS Coronavirus 2 NEGATIVE NEGATIVE Final    Comment: (NOTE) SARS-CoV-2 target nucleic acids are NOT DETECTED.  The SARS-CoV-2 RNA is generally detectable in upper and lower respiratory specimens during the acute phase of infection. Negative results do not preclude SARS-CoV-2 infection, do not rule out co-infections with other pathogens, and should not be used as the sole basis for treatment or other patient management decisions. Negative results must be combined with clinical observations, patient history, and epidemiological information. The expected result is Negative.  Fact Sheet for Patients: HairSlick.no  Fact Sheet for Healthcare Providers: quierodirigir.com  This test is not yet approved or cleared by the [place] FDA and  has been authorized for detection and/or diagnosis of SARS-CoV-2 by FDA under an Emergency Use Authorization (EUA). This EUA will remain  in effect (meaning this test can be used) for the duration of the COVID-19 declaration under Se ction 564(b)(1) of the Act, 21 U.S.C. section 360bbb-3(b)(1), unless the authorization is terminated or revoked sooner.  Performed at [facility], [address], [place], [place] [address]      Labs: Basic Metabolic Panel: Recent Labs  Lab 09/14/20 1924 09/15/20 0308 09/16/20 0144 09/17/20 0320 09/20/20 0213  NA 136  135 136 137 136  K 4.2 3.8 3.9 4.1 4.3  CL 104 104 106 105 103  CO2 24 25 24 24 25    GLUCOSE 129* 125* 108* 131* 141*  BUN 12 12 12 11 12   CREATININE 1.14 1.00 1.13 1.12 1.19  CALCIUM 9.5 9.5 9.4 9.5 9.5  MG 2.2 2.1 2.1 2.0 2.2  PHOS 4.1 4.3 4.0 3.5 4.9*   Liver Function Tests: Recent Labs  Lab 09/14/20 1924 09/15/20 0308 09/16/20 0144 09/17/20 0320 09/20/20 0213  AST 27 25 32 27 25  ALT 37 36 47* 50* 40  ALKPHOS 74 64 68 69 69  BILITOT 0.5 0.5 0.6 0.5 0.5  PROT 7.5 7.0 7.1 6.7 7.2  ALBUMIN 3.6 3.4* 3.5 3.3* 3.4*   Recent Labs  Lab 09/16/20 0144  LIPASE 27   No results for input(s): AMMONIA in the last 168 hours. CBC: Recent Labs  Lab 09/14/20 1924 09/15/20 0308 09/16/20 0144 09/17/20 0320 09/20/20 0213  WBC 6.9 6.5 6.6 7.6 6.0  NEUTROABS  --  3.8 3.4 4.4 2.5  HGB 14.4 13.1 13.3 12.4* 13.0  HCT 42.1 38.8* 39.4 37.9* 40.0  MCV 91.7 93.0 93.1 95.2 96.2  PLT 221 215 203 185 173   Cardiac Enzymes: No results for input(s): CKTOTAL, CKMB, CKMBINDEX, TROPONINI in the last 168 hours. BNP: BNP (last 3 results) Recent Labs    09/12/20 1315  BNP 25.7    ProBNP (last 3 results) No results for input(s): PROBNP in the last 8760 hours.  CBG: No results for input(s): GLUCAP in the last 168 hours.     Signed:  09/22/20 MD.  [facility] 09/20/2020, 10:48 AM

## 2020-09-20 NOTE — Progress Notes (Signed)
Patient left AMA.  This writer spent 20 minutes with patient in attempts to get him to stay to speak with the MD about staying of discharging with medications and out patient follow up plan.  Patient agreed and when this writer left the room to get the MD the patient left the building.    Dr. Lowell Guitar and primary RN Rey made aware of the situation.  Jacqulyn Cane RN, BSN, CCRN-K

## 2020-09-25 ENCOUNTER — Emergency Department (EMERGENCY_DEPARTMENT_HOSPITAL)
Admission: EM | Admit: 2020-09-25 | Discharge: 2020-09-28 | Disposition: A | Discharge status: Home / Self Care | Payer: No Typology Code available for payment source | Source: Home / Self Care | Attending: Emergency Medicine | Admitting: Emergency Medicine

## 2020-09-25 ENCOUNTER — Other Ambulatory Visit: Payer: Self-pay

## 2020-09-25 ENCOUNTER — Emergency Department (HOSPITAL_COMMUNITY): Payer: No Typology Code available for payment source

## 2020-09-25 ENCOUNTER — Encounter (HOSPITAL_COMMUNITY): Payer: Self-pay | Admitting: Emergency Medicine

## 2020-09-25 DIAGNOSIS — I509 Heart failure, unspecified: Secondary | ICD-10-CM | POA: Insufficient documentation

## 2020-09-25 DIAGNOSIS — Z20822 Contact with and (suspected) exposure to covid-19: Secondary | ICD-10-CM | POA: Insufficient documentation

## 2020-09-25 DIAGNOSIS — R45850 Homicidal ideations: Secondary | ICD-10-CM | POA: Insufficient documentation

## 2020-09-25 DIAGNOSIS — F1410 Cocaine abuse, uncomplicated: Secondary | ICD-10-CM | POA: Insufficient documentation

## 2020-09-25 DIAGNOSIS — R079 Chest pain, unspecified: Secondary | ICD-10-CM

## 2020-09-25 DIAGNOSIS — I110 Hypertensive heart disease with heart failure: Secondary | ICD-10-CM | POA: Insufficient documentation

## 2020-09-25 DIAGNOSIS — F1010 Alcohol abuse, uncomplicated: Secondary | ICD-10-CM | POA: Insufficient documentation

## 2020-09-25 DIAGNOSIS — F17210 Nicotine dependence, cigarettes, uncomplicated: Secondary | ICD-10-CM | POA: Insufficient documentation

## 2020-09-25 DIAGNOSIS — R45851 Suicidal ideations: Secondary | ICD-10-CM | POA: Insufficient documentation

## 2020-09-25 DIAGNOSIS — Z79899 Other long term (current) drug therapy: Secondary | ICD-10-CM | POA: Insufficient documentation

## 2020-09-25 DIAGNOSIS — F1020 Alcohol dependence, uncomplicated: Secondary | ICD-10-CM | POA: Diagnosis present

## 2020-09-25 DIAGNOSIS — F1994 Other psychoactive substance use, unspecified with psychoactive substance-induced mood disorder: Secondary | ICD-10-CM | POA: Diagnosis present

## 2020-09-25 DIAGNOSIS — Y907 Blood alcohol level of 200-239 mg/100 ml: Secondary | ICD-10-CM | POA: Insufficient documentation

## 2020-09-25 DIAGNOSIS — F1094 Alcohol use, unspecified with alcohol-induced mood disorder: Secondary | ICD-10-CM | POA: Diagnosis present

## 2020-09-25 HISTORY — DX: Heart failure, unspecified: I50.9

## 2020-09-25 HISTORY — DX: Essential (primary) hypertension: I10

## 2020-09-25 HISTORY — DX: Pure hypercholesterolemia, unspecified: E78.00

## 2020-09-25 LAB — CBC
HCT: 40.8 % (ref 39.0–52.0)
Hemoglobin: 13.9 g/dL (ref 13.0–17.0)
MCH: 32.1 pg (ref 26.0–34.0)
MCHC: 34.1 g/dL (ref 30.0–36.0)
MCV: 94.2 fL (ref 80.0–100.0)
Platelets: 203 10*3/uL (ref 150–400)
RBC: 4.33 MIL/uL (ref 4.22–5.81)
RDW: 13.7 % (ref 11.5–15.5)
WBC: 6.7 10*3/uL (ref 4.0–10.5)
nRBC: 0 % (ref 0.0–0.2)

## 2020-09-25 LAB — TROPONIN I (HIGH SENSITIVITY)
Troponin I (High Sensitivity): 9 ng/L (ref <18)
Troponin I (High Sensitivity): 8 ng/L (ref <18)

## 2020-09-25 LAB — SALICYLATE LEVEL: Salicylate Lvl: <7 mg/dL — ABNORMAL LOW (ref 7.0–30.0)

## 2020-09-25 LAB — ETHANOL: Alcohol, Ethyl (B): 230 mg/dL — ABNORMAL HIGH (ref <10)

## 2020-09-25 LAB — RAPID URINE DRUG SCREEN, HOSP PERFORMED
Amphetamines: NOT DETECTED
Barbiturates: NOT DETECTED
Benzodiazepines: POSITIVE — AB
Cocaine: POSITIVE — AB
Opiates: NOT DETECTED
Tetrahydrocannabinol: POSITIVE — AB

## 2020-09-25 LAB — BASIC METABOLIC PANEL
Anion gap: 12 (ref 5–15)
BUN: 18 mg/dL (ref 6–20)
CO2: 22 mmol/L (ref 22–32)
Calcium: 9.3 mg/dL (ref 8.9–10.3)
Chloride: 104 mmol/L (ref 98–111)
Creatinine, Ser: 1.01 mg/dL (ref 0.61–1.24)
GFR, Estimated: >60 mL/min (ref >60)
Glucose, Bld: 86 mg/dL (ref 70–99)
Potassium: 4 mmol/L (ref 3.5–5.1)
Sodium: 138 mmol/L (ref 135–145)

## 2020-09-25 LAB — ACETAMINOPHEN LEVEL: Acetaminophen (Tylenol), Serum: <10 ug/mL — ABNORMAL LOW (ref 10–30)

## 2020-09-25 MED ORDER — THIAMINE HCL 100 MG/ML IJ SOLN: 100.0000 mg | Freq: Every day | INTRAMUSCULAR | Status: DC

## 2020-09-25 MED ORDER — LORAZEPAM 1 MG PO TABS
0.0000 mg | ORAL_TABLET | Freq: Four times a day (QID) | ORAL | Status: AC
  Administered 2020-09-26 – 2020-09-27 (×2): 1 mg via ORAL
  Filled 2020-09-25 (×2): qty 1

## 2020-09-25 MED ORDER — LORAZEPAM 1 MG PO TABS
1.0000 mg | ORAL_TABLET | Freq: Once | ORAL | Status: AC
  Administered 2020-09-25: 1 mg via ORAL
  Filled 2020-09-25: qty 1

## 2020-09-25 MED ORDER — LORAZEPAM 1 MG PO TABS
0.0000 mg | ORAL_TABLET | Freq: Two times a day (BID) | ORAL | Status: DC
  Administered 2020-09-28: 1 mg via ORAL
  Filled 2020-09-25: qty 1

## 2020-09-25 MED ORDER — EMPAGLIFLOZIN 10 MG PO TABS
10.0000 mg | ORAL_TABLET | Freq: Every day | ORAL | Status: DC
  Administered 2020-09-26 – 2020-09-28 (×4): 10 mg via ORAL
  Filled 2020-09-25 (×4): qty 1

## 2020-09-25 MED ORDER — SACUBITRIL-VALSARTAN 97-103 MG PO TABS
1.0000 | ORAL_TABLET | Freq: Two times a day (BID) | ORAL | Status: DC
  Administered 2020-09-26 – 2020-09-28 (×6): 1 via ORAL
  Filled 2020-09-25 (×7): qty 1

## 2020-09-25 MED ORDER — METOPROLOL SUCCINATE ER 25 MG PO TB24
100.0000 mg | ORAL_TABLET | Freq: Two times a day (BID) | ORAL | Status: DC
  Administered 2020-09-25 – 2020-09-28 (×6): 100 mg via ORAL
  Filled 2020-09-25 (×6): qty 4

## 2020-09-25 MED ORDER — SPIRONOLACTONE 25 MG PO TABS
25.0000 mg | ORAL_TABLET | Freq: Every day | ORAL | Status: DC
  Administered 2020-09-26: 25 mg via ORAL
  Filled 2020-09-25 (×4): qty 1

## 2020-09-25 MED ORDER — LORAZEPAM 2 MG/ML IJ SOLN: 0.0000 mg | Freq: Two times a day (BID) | INTRAMUSCULAR | Status: DC

## 2020-09-25 MED ORDER — ATORVASTATIN CALCIUM 40 MG PO TABS
40.0000 mg | ORAL_TABLET | Freq: Every day | ORAL | Status: DC
  Administered 2020-09-25 – 2020-09-28 (×4): 40 mg via ORAL
  Filled 2020-09-25 (×4): qty 1

## 2020-09-25 MED ORDER — LORAZEPAM 2 MG/ML IJ SOLN: 0.0000 mg | Freq: Four times a day (QID) | INTRAMUSCULAR | Status: AC

## 2020-09-25 MED ORDER — THIAMINE HCL 100 MG PO TABS
100.0000 mg | ORAL_TABLET | Freq: Every day | ORAL | Status: DC
  Administered 2020-09-26 – 2020-09-28 (×3): 100 mg via ORAL
  Filled 2020-09-25 (×3): qty 1

## 2020-09-25 NOTE — ED Notes (Signed)
1 belonging bag w/ clothes placed in [facility] [address].

## 2020-09-25 NOTE — ED Provider Notes (Signed)
[facility] EMERGENCY DEPARTMENT Provider Note   CSN: [id number] Arrival date & time: 09/25/20  1414     History Chief Complaint  Patient presents with   Chest Pain   Suicide Attempt    Bruce Moreno is a 50 y.o. male.  Presented to the emergency room with concern for chest pain, suicidal thoughts and homicidal thoughts.  Patient states that he has been having pain over the last few days, comes and goes, no obvious alleviating or rating factors.  Currently moderate.  Feels like pressure.  Similar to past episodes of chest pain, during recent admission.  States that he has continued to abuse cocaine and alcohol.  Has had thoughts of suicide as well as thoughts of wanting to kill his child's mother.  Recent admission for chest pain, alcohol abuse, hypertension.  HPI     Past Medical History:  Diagnosis Date   Congestive heart failure (CHF) (HCC)    Hypercholesteremia    Hypertension     Patient Active Problem List   Diagnosis Date Noted   Chest pain on exertion 09/12/2020   Chest pain 09/12/2020   Alcohol use disorder, severe, dependence (HCC) 08/24/2020   Alcohol-induced mood disorder with depressive symptoms (HCC) 08/24/2020   Alcohol intoxication in relapsed alcoholic (HCC) 08/24/2020   HFrEF (heart failure with reduced ejection fraction) (HCC) 05/18/2020   Pure hypercholesterolemia 02/10/2020   HTN (hypertension) 11/23/2019   Cocaine abuse (HCC) 05/12/2018    History reviewed. No pertinent surgical history.     History reviewed. No pertinent family history.  Social History   Tobacco Use   Smoking status: Some Days    Packs/day: 0.10    Years: 0.50    Pack years: 0.05    Types: Cigarettes    Start date: 03/11/2020   Smokeless tobacco: Never  Vaping Use   Vaping Use: Never used  Substance Use Topics   Alcohol use: Yes    Alcohol/week: 4.0 standard drinks    Types: 4 Standard drinks or equivalent per week   Drug use: Yes    Types:  Cocaine    Comment: smoked crack this AM    Home Medications Prior to Admission medications   Medication Sig Start Date End Date Taking? Authorizing Provider  atorvastatin (LIPITOR) 40 MG tablet Take 40 mg by mouth daily.   Yes [provider]  furosemide (LASIX) 20 MG tablet Take 20 mg by mouth daily as needed for edema.   Yes [provider]  JARDIANCE 10 MG TABS tablet Take 10 mg by mouth daily.   Yes [provider]  metoprolol succinate (TOPROL-XL) 100 MG 24 hr tablet Take 100 mg by mouth 2 (two) times daily. Take with or immediately following a meal.   Yes [provider]  sacubitril-valsartan (ENTRESTO) 97-103 MG Take 1 tablet by mouth 2 (two) times daily.   Yes [provider]  spironolactone (ALDACTONE) 25 MG tablet Take 25 mg by mouth daily.   Yes [provider]    Allergies    Patient has no known allergies.  Review of Systems   Review of Systems  Constitutional:  Negative for chills and fever.  HENT:  Negative for ear pain and sore throat.   Eyes:  Negative for pain and visual disturbance.  Respiratory:  Positive for chest tightness. Negative for cough and shortness of breath.   Cardiovascular:  Positive for chest pain. Negative for palpitations.  Gastrointestinal:  Negative for abdominal pain and vomiting.  Genitourinary:  Negative for dysuria and hematuria.  Musculoskeletal:  Negative for arthralgias and back pain.  Skin:  Negative for color change and rash.  Neurological:  Negative for seizures and syncope.  All other systems reviewed and are negative.  Physical Exam Updated Vital Signs BP (!) 158/103 (BP Location: Left Arm)   Pulse (!) 110   Temp 97.6 F (36.4 C) (Oral)   Resp 20   Ht 5\' 6"  (1.676 m)   Wt 87 kg   SpO2 100%   BMI 30.96 kg/m   Physical Exam Vitals and nursing note reviewed.  Constitutional:      Appearance: He is well-developed.  HENT:     Head: Normocephalic and atraumatic.  Eyes:      Conjunctiva/sclera: Conjunctivae normal.  Cardiovascular:     Rate and Rhythm: Normal rate and regular rhythm.     Heart sounds: No murmur heard. Pulmonary:     Effort: Pulmonary effort is normal. No respiratory distress.     Breath sounds: Normal breath sounds.  Abdominal:     Palpations: Abdomen is soft.     Tenderness: There is no abdominal tenderness.  Musculoskeletal:     Cervical back: Neck supple.  Skin:    General: Skin is warm and dry.  Neurological:     General: No focal deficit present.     Mental Status: He is alert.    ED Results / Procedures / Treatments   Labs (all labs ordered are listed, but only abnormal results are displayed) Labs Reviewed  ETHANOL - Abnormal; Notable for the following components:      Result Value   Alcohol, Ethyl (B) 230 (*)    All other components within normal limits  SALICYLATE LEVEL - Abnormal; Notable for the following components:   Salicylate Lvl <7.0 (*)    All other components within normal limits  ACETAMINOPHEN LEVEL - Abnormal; Notable for the following components:   Acetaminophen (Tylenol), Serum <10 (*)    All other components within normal limits  BASIC METABOLIC PANEL  CBC  RAPID URINE DRUG SCREEN, HOSP PERFORMED  TROPONIN I (HIGH SENSITIVITY)  TROPONIN I (HIGH SENSITIVITY)    EKG EKG Interpretation  Date/Time:  Sunday September 25 2020 14:39:04 EDT Ventricular Rate:  120 PR Interval:  140 QRS Duration: 84 QT Interval:  350 QTC Calculation: 494 R Axis:   74 Text Interpretation: Sinus tachycardia Minimal voltage criteria for LVH, may be normal variant ( Sokolow-Lyon ) Borderline ECG Confirmed by [id number] (Marianna Fuss) on 09/25/2020 3:51:03 PM  Radiology DG Chest 2 View  Result Date: 09/25/2020 CLINICAL DATA:  Chest pain after smoking crack and consuming alcohol today, history CHF, hypertension, smoker EXAM: CHEST - 2 VIEW COMPARISON:  09/12/2020 FINDINGS: Normal heart size, mediastinal contours, and  pulmonary vascularity. Lungs clear. No pulmonary infiltrate, pleural effusion, or pneumothorax. Mild thoracic levoconvex scoliosis, chronic. No acute osseous findings. IMPRESSION: No acute abnormalities. Electronically Signed   By: 09/14/2020 M.D.   On: 09/25/2020 15:29    Procedures Procedures   Medications Ordered in ED Medications  LORazepam (ATIVAN) tablet 1 mg (1 mg Oral Given 09/25/20 1723)  LORazepam (ATIVAN) tablet 1 mg (1 mg Oral Given 09/25/20 2112)    ED Course  I have reviewed the triage vital signs and the nursing notes.  Pertinent labs & imaging results that were available during my care of the patient were reviewed by me and considered in my medical decision making (see chart for details).    MDM Rules/Calculators/A&P  50 year old male presenting to ER with concern for chest pain and suicidal thoughts.  On exam patient appears well in no distress.  Vitals stable, except noted initial mild tachycardia.  Chest x-ray negative for acute pathology.  EKG without acute ischemic change, troponin x2 within normal limits, low suspicion for ACS at present.  Chest pain may be related to cocaine abuse.  Provided Ativan.  On reassessment remains well-appearing, no ongoing pain.  Will consult TTS given his reported SI and HI.  Patient is medically cleared at present. Is currently voluntary.   Final Clinical Impression(s) / ED Diagnoses Final diagnoses:  Alcohol abuse  Cocaine abuse (HCC)  Suicidal ideation  Homicidal ideation  Chest pain, unspecified type    Rx / DC Orders ED Discharge Orders     None        Milagros Loll, MD 09/25/20 2157

## 2020-09-25 NOTE — BH Assessment (Signed)
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 and substance use disorder. Acute risk factors for suicide include: family or marital conflict, unemployment, social withdrawal/isolation, and loss (financial, interpersonal, professional). Protective factors for this patient include: positive social support, positive therapeutic relationship, responsibility to others (children, family), coping skills, hope for the future, and life satisfaction. Considering these factors, the overall suicide risk at this point appears to be high. Patient is not appropriate for outpatient follow up.   Disposition: Bruce Back PA-C, patient meets inpatient criteria.  [facility] AC contacted, no available beds.  Disposition discussed with [facility] representative, via secure chat in Epic.  RN to discuss disposition with EDP.   Bruce Moreno is a 50 years old male who presents voluntarily to [facility] ED accompanied by his brother Bruce Moreno, [phone].  Pt did not give TTS permission to contact his brother. Pt reports suicidal ideations, "I tried to kill myself by overdosing on crack cocaine and drinking alcohol.  Pt  reported that his son died on 2020-03-23"I don't want to live, I want to go with him".  Pt acknowledged symptoms including daily crying spells, irritable, isolation, hopelessness, guilt, decreased concentration and worthlessness. Pt reports that he is sleeping two or three hours during the night.  Pt reports that he is skipping meals.  Pt reports previous suicide thoughts, no plan. Pt reports homicidal ideation, "I want to kill my baby's mommy, and choke her to death". Pt denied AVH and paranoia.  Pt denied intentional self injurious behaviors.  Pt says he has been drinking 1/2 gallon of liquor daily and using crack cocaine twice a week; also, denies any other substance use. Pt reports he smoke cigarettes daily.  Pt identifies his primary stressor as grieving the death of his oldest son and his second baby's mom not letting him see his other child ( 69 years old).  Pt reports that he is currently living with his brother, Bruce Moreno; currently unemployed.  Pt reports no family history of mental illness; also, reports no family history of substance used.  Pt denies any current legal problems.  Pt denies any guns or weapons in the house.  Pt says he is not currently receiving weekly outpatient therapy; also, reports no outpatient medication management.  Pt reports that he had three years of sobriety from alcohol, during that time he attended AA meetings and connected with a sponsor. Pt reports one previous inpatient psychiatric hospitalization in August 2022.  Pt reports he has a diagnosis of hypertension.  Pt is dressed in scrubs, alert, oriented x 5 with normal speech and restless motor behavior.  Eye contact is good and Pt is tearful.  Pt mood is depressed and affect is depressed.  Thought process is relevant.  Pt's insight is lacking and judgement is impaired.  There is no indication Pt is currently responding to internal stimuli or experiencing delusional thought content.  Pt was cooperative  throughout assessment.   CCA Screening, Triage and Referral (STR)  Patient Reported Information How did you hear about us? Family/Friend  What Is the Reason for Your Visit/Call Today? SI  How Long Has This Been Causing You Problems? 1-6 months  What Do You Feel Would Help You the Most Today? Alcohol or Drug Use Treatment; Stress Management   Have You Recently Had Any Thoughts About Hurting Yourself? Yes  Are You Planning to Commit Suicide/Harm Yourself At This time? Yes   Have you Recently Had Thoughts About Hurting Someone Bruce Moreno? Yes  Are You Planning to Harm Someone at This Time? No  Explanation: No data recorded  Have You Used Any Alcohol or Drugs in the Past 24 Hours? Yes  How Long Ago Did You Use Drugs or Alcohol? No data recorded What Did You Use and How Much? Alcohol, Cocaine/Crack   Do You Currently Have a Therapist/Psychiatrist? No  Name of Therapist/Psychiatrist: No data recorded  Have You Been Recently Discharged From Any Office Practice or Programs? No  Explanation of Discharge From Practice/Program: No data recorded    CCA Screening Triage Referral Assessment Type of Contact: Tele-Assessment  Telemedicine Service Delivery: Telemedicine service delivery: This service was provided via telemedicine using a 2-way, interactive audio and video technology  Is this Initial or Reassessment? Initial Assessment  Date Telepsych consult ordered in [facility]:  09/26/20  Time Telepsych consult ordered in [facility]:  No data recorded [facility]  [facility]   Collateral Involvement: No collateral involved   Does Patient Have a Automotive engineerCourt Appointed Legal Guardian? No data recorded Name and Contact of Legal Guardian: No data recorded If Minor and Not Living with Parent(s), Who has Custody? n/a  Is CPS involved or ever been involved? Never  Is APS involved or ever been involved? Never   Patient Determined To Be At Risk  for Harm To Self or Others Based on Review of Patient Reported Information or Presenting Complaint? Yes, for Harm to Others  Method: Plan with intent and identified person (Pt reports that he wants to choke his baby  mommy to death.)  Availability of Means: In hand or used  Intent: Clearly intends on inflicting harm that could cause death  Notification Required: Another person is identifiable and needs to be warned to ensure safety (DUTY TO WARN)  Additional Information for Danger to Others Potential: -- (UTA)  Additional Comments for Danger to Others Potential: Pt reports that his babymom will not let him see his son.  Are There Guns or Other Weapons in Your Home? No  Types of Guns/Weapons: No data recorded Are These Weapons Safely Secured?                            No data recorded Who Could Verify You Are Able To Have These Secured: No data recorded Do You Have any Outstanding Charges, Pending Court Dates, Parole/Probation? Pt reports no outstanding charges or pending legal case.  Contacted To Inform of Risk of Harm To Self or Others: Intended Target for Harm to Others:    Does Patient Present under Involuntary Commitment? No  IVC Papers Initial File Date: No data recorded  [place]   Patient Currently Receiving the Following  Services: Not Receiving Services   Determination of Need: Emergent (2 hours)   Options For Referral: Inpatient Hospitalization; Medication Management     CCA Biopsychosocial Patient Reported Schizophrenia/Schizoaffective Diagnosis in Past: No   Strengths: Asking for help   Mental Health Symptoms Depression:   Change in energy/activity; Difficulty Concentrating; Fatigue; Hopelessness; Weight gain/loss; Tearfulness; Sleep (too much or little); Irritability; Increase/decrease in appetite; Worthlessness   Duration of Depressive symptoms:  Duration of Depressive Symptoms: Greater than two weeks   Mania:   None    Anxiety:    None   Psychosis:   None   Duration of Psychotic symptoms:    Trauma:   None   Obsessions:   None   Compulsions:   None   Inattention:   N/A   Hyperactivity/Impulsivity:   N/A   Oppositional/Defiant Behaviors:   N/A   Emotional Irregularity:   Chronic feelings of emptiness; Recurrent suicidal behaviors/gestures/threats   Other Mood/Personality Symptoms:   Depressed/irritable    Mental Status Exam Appearance and self-care  Stature:   Average   Weight:   Average weight   Clothing:   -- (Pt dressed in scrubs.)   Grooming:   Normal   Cosmetic use:   None   Posture/gait:   Slumped   Motor activity:   Not Remarkable   Sensorium  Attention:   Persistent   Concentration:   Preoccupied   Orientation:   X5   Recall/memory:   Normal   Affect and Mood  Affect:   Tearful; Depressed   Mood:   Depressed   Relating  Eye contact:   Normal   Facial expression:   Responsive   Attitude toward examiner:   Cooperative   Thought and Language  Speech flow:  Clear and Coherent   Thought content:   Appropriate to Mood and Circumstances   Preoccupation:   None   Hallucinations:   None   Organization:  No data recorded  [employer] of Knowledge:   Good   Intelligence:   Average   Abstraction:   Normal   Judgement:   Impaired   Reality Testing:   Distorted   Insight:   Lacking   Decision Making:   Impulsive   Social Functioning  Social Maturity:   Responsible   Social Judgement:   Normal   Stress  Stressors:   Grief/losses; Illness   Coping Ability:   Deficient supports   Skill Deficits:   Self-control   Supports:   Support needed     Religion: Religion/Spirituality Are You A Religious Person?: No How Might This Affect Treatment?: UTA  Leisure/Recreation: Leisure / Recreation Do You Have Hobbies?: No  Exercise/Diet: Exercise/Diet Do You Exercise?: No Have You  Gained or Lost A Significant Amount of Weight in the Past Six Months?: No Do You Follow a Special Diet?: No Do You Have Any Trouble Sleeping?: Yes Explanation of Sleeping Difficulties: Pt reports he is sleeping 2 or 3 hours during the night   CCA Employment/Education Employment/Work Situation: Employment / Work Situation Employment Situation: Unemployed (UTA) Patient's Job has Been Impacted by Current Illness:  (UTA) Has Patient ever Been in the [employer]?: No (UTA)  Education: Education Is Patient Currently Attending School?: No Last Grade Completed: 12 (UTA) Did You Attend College?: No (UTA) Did You Have An Individualized Education Program (IIEP):  (UTA) Did You Have Any Difficulty At School?:  (UTA) Patient's Education Has Been Impacted by Current Illness:  (UTA)   CCA Family/Childhood History  Family and Relationship History: Family history Does patient have children?: Yes How many children?: 1 (Pt reports one deceased child) How is patient's relationship with their children?: distance  Childhood History:  Childhood History By whom was/is the patient raised?:  (UTA) Did patient suffer any verbal/emotional/physical/sexual abuse as a child?: No Did patient suffer from severe childhood neglect?: No Has patient ever been sexually abused/assaulted/raped as an adolescent or adult?: No Was the patient ever a victim of a crime or a disaster?: No Witnessed domestic violence?: No Has patient been affected by domestic violence as an adult?: No  Child/Adolescent Assessment:     CCA Substance Use Alcohol/Drug Use: Alcohol / Drug Use Pain Medications: see MAR Prescriptions: see MAR Over the Counter: see <AR History of alcohol / drug use?: Yes Longest period of sobriety (when/how long): 3-4 years, relapsed on 7/29 Negative Consequences of Use: Financial, Personal relationships, Work / Programmer, multimedia Withdrawal Symptoms: Agitation, Aggressive/Assaultive, Irritability Substance  #1 Name of Substance 1: Alcohol 1 - Age of First Use: 39 1 - Amount (size/oz): 1/2 gallon liqour 1 - Frequency: daily 1 - Duration: ongoing 1 - Last Use / Amount: 09/25/20 1 - Method of Aquiring: purchase 1- Route of Use: drinking Substance #2 Name of Substance 2: Cocaine/Crack 2 - Age of First Use: UTA 2 - Amount (size/oz): $1000.00 2 - Frequency: twice a week 2 - Duration: ongoing 2 - Last Use / Amount: 09/25/20 2 - Method of Aquiring: UTA 2 - Route of Substance Use: smoking                     ASAM's:  Six Dimensions of Multidimensional Assessment  Dimension 1:  Acute Intoxication and/or Withdrawal Potential:   Dimension 1:  Description of individual's past and current experiences of substance use and withdrawal: Pt reports three years of sobriety; also, reports that he attended [employer] and had a sponsor during that time.  Dimension 2:  Biomedical Conditions and Complications:   Dimension 2:  Description of patient's biomedical conditions and  complications: chest pains, hypertension  Dimension 3:  Emotional, Behavioral, or Cognitive Conditions and Complications:  Dimension 3:  Description of emotional, behavioral, or cognitive conditions and complications: depression  Dimension 4:  Readiness to Change:  Dimension 4:  Description of Readiness to Change criteria: contemplation  Dimension 5:  Relapse, Continued use, or Continued Problem Potential:  Dimension 5:  Relapse, continued use, or continued problem potential critiera description: continued used  Dimension 6:  Recovery/Living Environment:  Dimension 6:  Recovery/Iiving environment criteria description: Pt reports that he lives with his brother  ASAM Severity Score: ASAM's Severity Rating Score: 15  ASAM Recommended Level of Treatment: ASAM Recommended Level of Treatment: Level II Partial Hospitalization Treatment   Substance use Disorder (SUD) Substance Use Disorder (SUD)  Checklist Symptoms of Substance Use:  Continued use despite having a persistent/recurrent physical/psychological problem caused/exacerbated by use, Continued use despite persistent or recurrent social, interpersonal problems, caused or exacerbated by use, Large amounts of time spent to obtain, use or recover from the substance(s), Presence of craving or strong urge to use, Evidence of withdrawal (Comment), Recurrent use that results in a failure to fulfill major role obligations (work, school, home), Social, occupational, recreational activities given up or reduced due to use, Repeated use in physically hazardous situations, Substance(s) often taken in larger amounts or over longer times than was intended, Persistent desire or unsuccessful efforts to cut down or control use  Recommendations for Services/Supports/Treatments: Recommendations for  Services/Supports/Treatments Recommendations For Services/Supports/Treatments: Residential-Level 2, SAIOP (Substance Abuse Intensive Outpatient Program)  Discharge Disposition:    DSM5 Diagnoses: Patient Active Problem List   Diagnosis Date Noted   Chest pain on exertion 09/12/2020   Chest pain 09/12/2020   Alcohol use disorder, severe, dependence (HCC) 08/24/2020   Alcohol-induced mood disorder with depressive symptoms (HCC) 08/24/2020   Alcohol intoxication in relapsed alcoholic (HCC) 08/24/2020   HFrEF (heart failure with reduced ejection fraction) (HCC) 05/18/2020   Pure hypercholesterolemia 02/10/2020   HTN (hypertension) 11/23/2019   Cocaine abuse (HCC) 05/12/2018     Referrals to Alternative Service(s): Referred to Alternative Service(s):   Place:   Date:   Time:    Referred to Alternative Service(s):   Place:   Date:   Time:    Referred to Alternative Service(s):   Place:   Date:   Time:    Referred to Alternative Service(s):   Place:   Date:   Time:     Meryle Ready, Counselor

## 2020-09-25 NOTE — ED Triage Notes (Signed)
Pt arrives from home for left side cp x3 days, pt states 3 days ago he smoked crack in an attempt to kill himself. Pt reports he needs help but since his son died he doesn't care about anything. Endorses crack use this morning and ETOH.

## 2020-09-26 LAB — RESP PANEL BY RT-PCR (FLU A&B, COVID) ARPGX2
Influenza A by PCR: NEGATIVE
Influenza B by PCR: NEGATIVE
SARS Coronavirus 2 by RT PCR: NEGATIVE

## 2020-09-26 MED ORDER — ACETAMINOPHEN 500 MG PO TABS
1000.0000 mg | ORAL_TABLET | Freq: Once | ORAL | Status: AC
  Administered 2020-09-26: 1000 mg via ORAL
  Filled 2020-09-26: qty 2

## 2020-09-26 MED ORDER — ASPIRIN 81 MG PO CHEW
81.0000 mg | CHEWABLE_TABLET | Freq: Once | ORAL | Status: AC
  Administered 2020-09-26: 81 mg via ORAL
  Filled 2020-09-26: qty 1

## 2020-09-26 NOTE — ED Notes (Signed)
Pt endorsed chest pain around 2320.  Discussed with Dr. Erin Hearing.  EKG performed and ASA and Tylenol administered.

## 2020-09-26 NOTE — ED Notes (Signed)
Breakfast orders placed 

## 2020-09-26 NOTE — ED Notes (Signed)
Adjusted CIWA/ativan order for 0315 starting 09/26/20 as PO Ativan was given at 2112 and original CIWA/Ativan order was to start at 2215 09/25/20

## 2020-09-26 NOTE — ED Notes (Signed)
TTS at this time. 

## 2020-09-26 NOTE — ED Notes (Signed)
Meal tray provided. Pt is eating. 

## 2020-09-26 NOTE — ED Notes (Signed)
PT reports he wasn't able to eat much of dinner and missed lunch. Happy meal and coke provided.  Snack times reinforced for future care.

## 2020-09-26 NOTE — Progress Notes (Signed)
Patient continues to meet criteria for [facility] inpatient and has been faxed out per the recommendation of Dr. Lucianne Muss. Patient meets inpatient criteria per [name], PA-C. Patient referred to the following facilities:  [facility]  [address], [place] [address] [phone]  [facility]  [address] [place] [address] [phone]  [facility]  [address], [place] [address] [phone]  [facility]  [address] [place] [address] [phone]  [facility]  [address] [place], [place] [address] [phone]  [facility]  [address] [place], [place] [address] [phone]  [facility]  [address] [place] [address] [phone]  [facility]  [address] [place] [address] [phone]  [facility]  [address] [place] [address] [phone]  [facility]  [address] [place] [address] [phone]    CSW will continue to monitor disposition.    Damita Dunnings, MSW, LCSW-A  10:05 AM 09/26/2020

## 2020-09-26 NOTE — ED Notes (Signed)
PT given cheese and crackers

## 2020-09-27 ENCOUNTER — Encounter (HOSPITAL_COMMUNITY): Payer: Self-pay | Admitting: Registered Nurse

## 2020-09-27 DIAGNOSIS — F1994 Other psychoactive substance use, unspecified with psychoactive substance-induced mood disorder: Secondary | ICD-10-CM | POA: Diagnosis present

## 2020-09-27 DIAGNOSIS — F1410 Cocaine abuse, uncomplicated: Secondary | ICD-10-CM | POA: Diagnosis not present

## 2020-09-27 DIAGNOSIS — F1014 Alcohol abuse with alcohol-induced mood disorder: Secondary | ICD-10-CM | POA: Diagnosis not present

## 2020-09-27 DIAGNOSIS — Z5900 Homelessness unspecified: Secondary | ICD-10-CM

## 2020-09-27 DIAGNOSIS — F1910 Other psychoactive substance abuse, uncomplicated: Secondary | ICD-10-CM | POA: Diagnosis not present

## 2020-09-27 DIAGNOSIS — F1010 Alcohol abuse, uncomplicated: Secondary | ICD-10-CM | POA: Insufficient documentation

## 2020-09-27 DIAGNOSIS — R45851 Suicidal ideations: Secondary | ICD-10-CM

## 2020-09-27 MED ORDER — LOPERAMIDE HCL 2 MG PO CAPS
4.0000 mg | ORAL_CAPSULE | Freq: Four times a day (QID) | ORAL | Status: DC | PRN
  Administered 2020-09-27: 4 mg via ORAL
  Filled 2020-09-27: qty 2

## 2020-09-27 NOTE — ED Notes (Signed)
Patient c/o diarrhea. MD notified.

## 2020-09-27 NOTE — Progress Notes (Signed)
Patient has been faxed out due to [facility] being under COVID restrictions. Patient meets inpatient criteria per [name], PA-C. Patient referred to the following facilities:  [facility]  [address] [place] [address] [phone]  [facility]  [address] [place] [address] [phone]  [facility]  [address] [place] [address] [phone]  [facility]  [address] [place] [address] [phone]  [facility]  [address] [place], [place] [address] [phone]  [facility]  [address] [place], [place] [address] [phone]  [facility]  [address] [place] [address] [phone]  [facility]  [address] [place] [address] [phone]  [facility]  [address], [place] [address] [phone]  [facility]  [address] [place] [address] [phone]    CSW will continue to monitor disposition.    Damita Dunnings, MSW, LCSW-A  10:25 AM 09/27/2020
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
## 2020-09-28 ENCOUNTER — Other Ambulatory Visit: Payer: Self-pay | Admitting: Registered Nurse

## 2020-09-28 ENCOUNTER — Inpatient Hospital Stay (HOSPITAL_COMMUNITY)
Admission: AD | Admit: 2020-09-28 | Discharge: 2020-10-06 | DRG: 885 | Disposition: A | Discharge status: Home / Self Care | Payer: No Typology Code available for payment source | Source: Intra-hospital | Attending: Emergency Medicine | Admitting: Emergency Medicine

## 2020-09-28 DIAGNOSIS — Z5900 Homelessness unspecified: Secondary | ICD-10-CM

## 2020-09-28 DIAGNOSIS — F1290 Cannabis use, unspecified, uncomplicated: Secondary | ICD-10-CM

## 2020-09-28 DIAGNOSIS — R45851 Suicidal ideations: Secondary | ICD-10-CM | POA: Diagnosis present

## 2020-09-28 DIAGNOSIS — F1094 Alcohol use, unspecified with alcohol-induced mood disorder: Secondary | ICD-10-CM | POA: Diagnosis not present

## 2020-09-28 DIAGNOSIS — I110 Hypertensive heart disease with heart failure: Secondary | ICD-10-CM | POA: Diagnosis present

## 2020-09-28 DIAGNOSIS — E7800 Pure hypercholesterolemia, unspecified: Secondary | ICD-10-CM | POA: Diagnosis present

## 2020-09-28 DIAGNOSIS — R45850 Homicidal ideations: Secondary | ICD-10-CM | POA: Diagnosis present

## 2020-09-28 DIAGNOSIS — F332 Major depressive disorder, recurrent severe without psychotic features: Secondary | ICD-10-CM | POA: Diagnosis present

## 2020-09-28 DIAGNOSIS — F1024 Alcohol dependence with alcohol-induced mood disorder: Secondary | ICD-10-CM | POA: Diagnosis present

## 2020-09-28 DIAGNOSIS — I5022 Chronic systolic (congestive) heart failure: Secondary | ICD-10-CM | POA: Diagnosis present

## 2020-09-28 DIAGNOSIS — Z20822 Contact with and (suspected) exposure to covid-19: Secondary | ICD-10-CM | POA: Diagnosis present

## 2020-09-28 DIAGNOSIS — R079 Chest pain, unspecified: Secondary | ICD-10-CM | POA: Diagnosis present

## 2020-09-28 DIAGNOSIS — F1994 Other psychoactive substance use, unspecified with psychoactive substance-induced mood disorder: Secondary | ICD-10-CM | POA: Diagnosis present

## 2020-09-28 DIAGNOSIS — F419 Anxiety disorder, unspecified: Secondary | ICD-10-CM | POA: Diagnosis present

## 2020-09-28 DIAGNOSIS — F1020 Alcohol dependence, uncomplicated: Secondary | ICD-10-CM | POA: Diagnosis present

## 2020-09-28 DIAGNOSIS — Z79899 Other long term (current) drug therapy: Secondary | ICD-10-CM

## 2020-09-28 DIAGNOSIS — F1410 Cocaine abuse, uncomplicated: Secondary | ICD-10-CM | POA: Diagnosis present

## 2020-09-28 DIAGNOSIS — Z8249 Family history of ischemic heart disease and other diseases of the circulatory system: Secondary | ICD-10-CM | POA: Diagnosis not present

## 2020-09-28 DIAGNOSIS — F1910 Other psychoactive substance abuse, uncomplicated: Secondary | ICD-10-CM | POA: Diagnosis not present

## 2020-09-28 DIAGNOSIS — F17210 Nicotine dependence, cigarettes, uncomplicated: Secondary | ICD-10-CM | POA: Diagnosis present

## 2020-09-28 DIAGNOSIS — I10 Essential (primary) hypertension: Secondary | ICD-10-CM | POA: Diagnosis present

## 2020-09-28 DIAGNOSIS — Y907 Blood alcohol level of 200-239 mg/100 ml: Secondary | ICD-10-CM | POA: Diagnosis present

## 2020-09-28 DIAGNOSIS — F333 Major depressive disorder, recurrent, severe with psychotic symptoms: Secondary | ICD-10-CM | POA: Diagnosis not present

## 2020-09-28 DIAGNOSIS — R0789 Other chest pain: Secondary | ICD-10-CM | POA: Diagnosis not present

## 2020-09-28 DIAGNOSIS — I252 Old myocardial infarction: Secondary | ICD-10-CM

## 2020-09-28 DIAGNOSIS — F1014 Alcohol abuse with alcohol-induced mood disorder: Secondary | ICD-10-CM | POA: Diagnosis not present

## 2020-09-28 HISTORY — DX: Chronic systolic (congestive) heart failure: I50.22

## 2020-09-28 LAB — POC SARS CORONAVIRUS 2 AG -  ED: SARSCOV2ONAVIRUS 2 AG: NEGATIVE

## 2020-09-28 MED ORDER — ATORVASTATIN CALCIUM 40 MG PO TABS
40.0000 mg | ORAL_TABLET | Freq: Every day | ORAL | Status: DC
  Administered 2020-09-29 – 2020-10-06 (×8): 40 mg via ORAL
  Filled 2020-09-28 (×11): qty 1

## 2020-09-28 MED ORDER — SACUBITRIL-VALSARTAN 97-103 MG PO TABS
1.0000 | ORAL_TABLET | Freq: Two times a day (BID) | ORAL | Status: DC
  Administered 2020-09-29 – 2020-10-05 (×12): 1 via ORAL
  Filled 2020-09-28 (×18): qty 1

## 2020-09-28 MED ORDER — ALUM & MAG HYDROXIDE-SIMETH 200-200-20 MG/5ML PO SUSP
30.0000 mL | ORAL | Status: DC | PRN
Start: 2020-09-28 — End: 2020-10-06

## 2020-09-28 MED ORDER — EMPAGLIFLOZIN 10 MG PO TABS
10.0000 mg | ORAL_TABLET | Freq: Every day | ORAL | Status: DC
  Administered 2020-09-29 – 2020-10-06 (×8): 10 mg via ORAL
  Filled 2020-09-28 (×11): qty 1

## 2020-09-28 MED ORDER — SPIRONOLACTONE 25 MG PO TABS
25.0000 mg | ORAL_TABLET | Freq: Every day | ORAL | Status: DC
  Filled 2020-09-28 (×6): qty 1

## 2020-09-28 MED ORDER — ACETAMINOPHEN 325 MG PO TABS
650.0000 mg | ORAL_TABLET | Freq: Four times a day (QID) | ORAL | Status: DC | PRN
  Administered 2020-09-29 – 2020-10-03 (×2): 650 mg via ORAL
  Filled 2020-09-28 (×2): qty 2

## 2020-09-28 MED ORDER — METOPROLOL SUCCINATE ER 100 MG PO TB24
100.0000 mg | ORAL_TABLET | Freq: Two times a day (BID) | ORAL | Status: DC
  Administered 2020-09-29 – 2020-10-04 (×12): 100 mg via ORAL
  Filled 2020-09-28 (×20): qty 1

## 2020-09-28 MED ORDER — HYDROXYZINE HCL 25 MG PO TABS
25.0000 mg | ORAL_TABLET | Freq: Three times a day (TID) | ORAL | Status: DC | PRN
  Administered 2020-09-28 – 2020-10-05 (×10): 25 mg via ORAL
  Filled 2020-09-28 (×10): qty 1

## 2020-09-28 MED ORDER — TRAZODONE HCL 50 MG PO TABS
50.0000 mg | ORAL_TABLET | Freq: Every evening | ORAL | Status: DC | PRN
  Administered 2020-09-28 – 2020-09-29 (×2): 50 mg via ORAL
  Filled 2020-09-28 (×2): qty 1

## 2020-09-28 MED ORDER — MAGNESIUM HYDROXIDE 400 MG/5ML PO SUSP: 30.0000 mL | Freq: Every day | ORAL | Status: DC | PRN

## 2020-09-28 NOTE — Progress Notes (Signed)
The patient attended the evening N.A.meeting and was appropriate.  

## 2020-09-28 NOTE — ED Notes (Addendum)
Pt very upset stating that he has not been able to get much rest due to having to go to bathroom. Pt states he has headache. Pt states this is from the Spironolactone he was given. Pt states that he is mad at the nurse who gave it to him. Pt states "I am very angry man right now I hate to say it. I want to hurt everyone right now." Pt educated on meds given. Pt states he is no longer going to take meds at night. Pt remains laying in bed and is redirectable at this time. Pt takes PO Ativan.

## 2020-09-28 NOTE — ED Notes (Signed)
PA Petrucelli and MD Ray notified of pt BP

## 2020-09-28 NOTE — ED Notes (Signed)
Safe Transport contacted  

## 2020-09-28 NOTE — ED Notes (Signed)
Pt resting at this time. Will obtain vitals when pt is awake  

## 2020-09-28 NOTE — ED Notes (Signed)
breafast eaten, pt sleeping, sitter present.

## 2020-09-28 NOTE — ED Notes (Addendum)
Breakfast given/ sleeping 

## 2020-09-28 NOTE — ED Notes (Signed)
Voluntary consent faxed. [facility] busy and reports delay in intake of pts.  

## 2020-09-28 NOTE — ED Notes (Signed)
Meal requested to be delivered early d/t transport

## 2020-09-28 NOTE — Progress Notes (Signed)
Patient information has been sent to [facility] via secure chat to review for potential admission. Patient meets inpatient criteria per [name], PA-C.   Situation ongoing, CSW will continue to monitor progress.    Signed:  Damita Dunnings, MSW, LCSW-A  09/28/2020 2:13 PM

## 2020-09-28 NOTE — ED Notes (Signed)
Accepted to [facility] @ 1730. Report called to [phone]. Going to room 303-2, accepted by Dr. Gretta Cool. Will go by safe transport.

## 2020-09-28 NOTE — Progress Notes (Signed)
Pt accepted to [facility] 303-2   Patient meets inpatient criteria per [name], PA-C  Dr. Gretta Cool is the attending provider.    Call report to [phone]    Ella Bodo, RN @ [facility] ED notified.     Pt scheduled  to arrive at [facility] today at 1730.   Damita Dunnings, MSW, LCSW-A  2:56 PM 09/28/2020

## 2020-09-29 ENCOUNTER — Other Ambulatory Visit: Payer: Self-pay

## 2020-09-29 ENCOUNTER — Encounter (HOSPITAL_COMMUNITY): Payer: Self-pay | Admitting: Registered Nurse

## 2020-09-29 DIAGNOSIS — F332 Major depressive disorder, recurrent severe without psychotic features: Principal | ICD-10-CM

## 2020-09-29 DIAGNOSIS — F1290 Cannabis use, unspecified, uncomplicated: Secondary | ICD-10-CM

## 2020-09-29 LAB — BASIC METABOLIC PANEL
Anion gap: 8 (ref 5–15)
BUN: 19 mg/dL (ref 6–20)
CO2: 27 mmol/L (ref 22–32)
Calcium: 10.1 mg/dL (ref 8.9–10.3)
Chloride: 106 mmol/L (ref 98–111)
Creatinine, Ser: 1.17 mg/dL (ref 0.61–1.24)
GFR, Estimated: >60 mL/min (ref >60)
Glucose, Bld: 126 mg/dL — ABNORMAL HIGH (ref 70–99)
Potassium: 4.3 mmol/L (ref 3.5–5.1)
Sodium: 141 mmol/L (ref 135–145)

## 2020-09-29 LAB — D-DIMER, QUANTITATIVE: D-Dimer, Quant: 0.65 ug/mL-FEU — ABNORMAL HIGH (ref 0.00–0.50)

## 2020-09-29 LAB — BRAIN NATRIURETIC PEPTIDE: B Natriuretic Peptide: 16.5 pg/mL (ref 0.0–100.0)

## 2020-09-29 LAB — TROPONIN I (HIGH SENSITIVITY): Troponin I (High Sensitivity): 3 ng/L (ref <18)

## 2020-09-29 LAB — MAGNESIUM: Magnesium: 2.2 mg/dL (ref 1.7–2.4)

## 2020-09-29 MED ORDER — LORAZEPAM 2 MG/ML IJ SOLN: 1.0000 mg | INTRAMUSCULAR | Status: AC | PRN

## 2020-09-29 MED ORDER — THIAMINE HCL 100 MG PO TABS
100.0000 mg | ORAL_TABLET | Freq: Every day | ORAL | Status: DC
  Administered 2020-09-29 – 2020-10-06 (×8): 100 mg via ORAL
  Filled 2020-09-29 (×11): qty 1

## 2020-09-29 MED ORDER — FOLIC ACID 1 MG PO TABS
1.0000 mg | ORAL_TABLET | Freq: Every day | ORAL | Status: DC
  Administered 2020-09-29 – 2020-10-06 (×8): 1 mg via ORAL
  Filled 2020-09-29 (×11): qty 1

## 2020-09-29 MED ORDER — LORAZEPAM 1 MG PO TABS: 1.0000 mg | ORAL_TABLET | ORAL | Status: AC | PRN

## 2020-09-29 MED ORDER — PNEUMOCOCCAL VAC POLYVALENT 25 MCG/0.5ML IJ INJ
0.5000 mL | INJECTION | INTRAMUSCULAR | Status: DC
  Filled 2020-09-29: qty 0.5

## 2020-09-29 MED ORDER — ADULT MULTIVITAMIN W/MINERALS CH
1.0000 | ORAL_TABLET | Freq: Every day | ORAL | Status: DC
  Administered 2020-09-29 – 2020-10-06 (×8): 1 via ORAL
  Filled 2020-09-29 (×11): qty 1

## 2020-09-29 MED ORDER — THIAMINE HCL 100 MG/ML IJ SOLN: 100.0000 mg | Freq: Every day | INTRAMUSCULAR | Status: DC
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
## 2020-09-29 NOTE — Consult Note (Signed)
Consultation Note Internal Medicine   Renton Berkley ZOX:[id number] DOB: 03/01/1970 DOA: 09/28/2020  PCP: Maryelizabeth Rowan, MD  Patient coming from: home  I have personally briefly reviewed patient's old medical records in [facility].  Chief Complaint: suicidal and chest pain  HPI: Bruce Moreno is a 50 y.o. male with medical history significant for chronic systolic congestive heart failure (EF 25-30% by 04/2020 echocardiogram), hypertension, hyperlipidemia, cocaine abuse, alcohol abuse, former smoker, with no history of coronary artery disease or stent placement, who presents to the [facility] emergency department on 09/25/2020 with chest pain and suicidal thoughts, with recent relapse of alcohol and cocaine use.  His last cocaine use was the night before presentation.  He reports that he used a large amount of cocaine all at once because he was intending to kill himself.  Regarding the patient's chest pain: Patient has had chest pain episodes in the past and had a cardiac catheterization at [facility] on 05/18/2020 that showed no coronary artery disease.  His current episode of chest pain began 09/23/2020, 2 days prior to presentation, and it has been intermittent.  Chest pain is located in the left chest, radiates to the left arm,is up to 10/10 at times and is characterized as sharp and shooting. Symptoms are alleviated by nothing and exacerbated by nothing; chest pain is not worse with exertion. Associated symptoms: Shortness of breath.  No diaphoresis, palpitations, leg swelling, fever/chills, cough.  Has frequent headaches that are chronic.   ED Course: Patient was evaluated in the emergency department for his chest pain and his suicidal ideation.  He had an EKG for his chest pain and had high-sensitivity troponin levels within normal range x2.  Patient was evaluated by behavioral health, but unfortunately there were no behavioral health beds available, so patient remained in the emergency  department until he could be admitted on 09/29/2020 to behavioral health.  Hospital course: Patient was admitted to behavioral health.  He was continued on his home medications.  He was started on thiamine and alcohol withdrawal protocol was initiated.  Patient continued to note intermittent chest pain, so an internal medicine/hospitalist consult was called.   History:  Cardiac catheterization, 05/18/2020, [facility]: Cardiac Catheterization Laboratory  [facility], [place]  Tel: [phone]  Fax: [phone]   FINAL CARDIAC CATHETERIZATION REPORT  Date of Procedure: May 18, 2020  _________________________________________________________  Findings:  1. No significant coronary artery disease.  2. Normal right and left sided filling pressures.  3. Low-normal cardiac output.   Recommendations:  1. Aggressive secondary prevention.  2. Ambulatory referral to cardiac rehabilitation was placed and discussed  with patient.  3. Follow up with primary cardiologist.   Complications:   None   Echocardiogram, 05/16/20, [facility] Summary    1. The left ventricle is mildly dilated in size with moderately increased  wall thickness.    2. The left ventricular systolic function is severely decreased, LVEF is  visually estimated at 25-30%.    3. There is grade I diastolic dysfunction (impaired relaxation).    4. The right ventricle is normal in size, with normal systolic function.    5. There is no pulmonary hypertension, estimated pulmonary artery systolic  pressure is 19 mmHg.      Review of Systems: As per HPI otherwise all other systems reviewed and are unremarable.  GI: No abdominal pain, nausea, vomiting, diarrhea, constipation, or bloody stool.    Past Medical History:  Diagnosis Date  Chronic systolic CHF (congestive heart failure) (HCC)    [facility]: EF 25-30%, grade I diastolic dysfunction, no pulmonary hypertension.    Hypercholesteremia    Hypertension     Past Surgical History:  Procedure Laterality Date   CARDIAC CATHETERIZATION  05/18/2020   No significant coronary artery disease. Nomral right and left sided filling pressures. Low-normal cardiac output.    Social History  reports that he has been smoking cigarettes. He started smoking about 6 months ago. He has a 0.05 pack-year smoking history. He has never used smokeless tobacco. He reports current alcohol use of about 4.0 standard drinks per week. He reports current drug use. Drug: Cocaine.  No Known Allergies  Family History  Problem Relation Age of Onset   Hypertension Mother    Cancer Father    Heart attack Neg Hx      Home Medications  Prior to Admission medications   Medication Sig Start Date End Date Taking? Authorizing Provider  atorvastatin (LIPITOR) 40 MG tablet Take 40 mg by mouth daily.    [provider]  furosemide (LASIX) 20 MG tablet Take 20 mg by mouth daily as needed for edema.    [provider]  JARDIANCE 10 MG TABS tablet Take 10 mg by mouth daily.    [provider]  metoprolol succinate (TOPROL-XL) 100 MG 24 hr tablet Take 100 mg by mouth 2 (two) times daily. Take with or immediately following a meal.    [provider]  sacubitril-valsartan (ENTRESTO) 97-103 MG Take 1 tablet by mouth 2 (two) times daily.    [provider]  spironolactone (ALDACTONE) 25 MG tablet Take 25 mg by mouth daily.    [provider]    Physical Exam: Vitals:   09/29/20 0615 09/29/20 0617 09/29/20 1400 09/29/20 1542  BP: (!) 134/99 (!) 135/92 (!) 146/97 (!) 142/99  Pulse: 89 (!) 102 78 73  Resp:   18 18  Temp: 98 F (36.7 C)  98.3 F (36.8 C) 97.9 F (36.6 C)  TempSrc: Oral  Oral Esophageal  SpO2:   98% 98%  Height:        Constitutional: NAD, calm, comfortable, ill-appearing. Vitals:   09/29/20 0615 09/29/20 0617 09/29/20 1400 09/29/20 1542  BP: (!) 134/99 (!) 135/92 (!)  146/97 (!) 142/99  Pulse: 89 (!) 102 78 73  Resp:   18 18  Temp: 98 F (36.7 C)  98.3 F (36.8 C) 97.9 F (36.6 C)  TempSrc: Oral  Oral Esophageal  SpO2:   98% 98%  Height:       Eyes: Pupils equal and round, lids and conjunctivae without icterus or erythema. ENMT: Mucous membranes are dry. Posterior pharynx clear of any exudate or lesions. Nares patent without discharge or bleeding.  Normocephalic, atraumatic.  Normal dentition.  Neck: normal, supple, no masses, trachea midline.  Thyroid nontender, no masses appreciated, no thyromegaly. Respiratory: clear to auscultation bilaterally. Chest wall movements are symmetric. No wheezing, no crackles.  No rhonchi.  Normal respiratory effort. No accessory muscle use.  Cardiovascular: Regular rate and rhythm, no murmurs / rubs / gallops. Pulses: DP pulses 2+ bilaterally. No carotid bruits.  Capillary refill less than 3 seconds. Edema: None bilaterally.  No JVD. GI: soft, non-distended, normal active bowel sounds. No hepatosplenomegaly. No rigidity, rebound, or guarding. No CVA tenderness bilaterally.  No masses palpated.  Musculoskeletal: no clubbing / cyanosis. No joint deformity upper and lower extremities. Good ROM, no contractures. Normal muscle tone.  No tenderness or deformity in the back bilaterally.  Tenderness to palpation of the left anterior chest wall. Integument: no rashes, lesions, ulcers. No induration. Clean, dry, intact. Neurologic: CN 2-12 grossly intact. Sensation grossly intact to light touch. DTR 2+ bilaterally.  Babinski: Toes downgoing bilaterally.  Strength 5/5 in all 4.  Intact rapid alternating movements bilaterally.  No pronator drift. Psychiatric:  Alert and oriented x 3. Flat affect. Lymphatic: No cervical lymphadenopathy. No supraclavicular lymphadenopathy.   Labs on Admission: I have personally reviewed the following labs and imaging studies.  CBC: Recent Labs  Lab 09/25/20 1500  WBC 6.7  HGB 13.9  HCT 40.8   MCV 94.2  PLT 203    Basic Metabolic Panel: Recent Labs  Lab 09/25/20 1500  NA 138  K 4.0  CL 104  CO2 22  GLUCOSE 86  BUN 18  CREATININE 1.01  CALCIUM 9.3    GFR: Estimated Creatinine Clearance: 89.5 mL/min (by C-G formula based on SCr of 1.01 mg/dL).    Radiological Exams on Admission:  Viewed personally:  Chest x-ray, 09/25/2020: Impression: No acute process.   EKG: Independently reviewed.    EKG #1: 65 bpm. Normal sinus rhythm.  T wave inversion in lead III.  Flat T in aVF.  Slight J-point elevation in leads V2 and V3.  QTc 433.  Compared with previous EKG from 09/27/2020, flat T wave in aVF is old; inverted T wave in 3 is new; J-point elevation in V2 and V3 are new.  Compared with previous EKG from 09/15/2020, T wave inversion in V2 is now resolved; J-point elevation in V2 is new; J-point elevation in V3 is old.   EKG #2: 64 bpm.  Normal sinus rhythm.  T wave inversion in lead III.  Flat T in aVF.  Slight J-point elevation in leads V2 and V3.  QTc 433.  Compared with previous EKG from several minutes prior, no significant change.    __________________________________________________________    Assessment/Plan  Principal Problem:   MDD (major depressive disorder), recurrent episode, severe (HCC) present on admission: Yes Plan: Admitted to behavioral health.  Management per psychiatrist.  Active Problems:   Chronic systolic CHF (congestive heart failure) (HCC).  Present on admission: Yes. No evidence of acute CHF.  EF 25-30% by echocardiogram from April 2022. Plan: Continue home medications including beta-blocker (metoprolol), Entresto, and spironolactone.  Monitor I's/O.  Monitor for any peripheral edema or hypoxia.  Continue outpatient follow-up with cardiologist at [facility].    Chest pain Present on admission: Yes [facility] notes indicate the patient does not have coronary artery disease.  Just had recent cardiac catheterization on 05/15/2020 that showed no significant  stenosis.  Chance that this is cardiac chest pain is very small.  He had 2 negative troponin values in the emergency department 4 days prior and has continued to have intermittent chest pain.  Suspect this is musculoskeletal pain.  With his cocaine use, he does have increased risks, but has not had any further cocaine. Plan: We will check additional troponin x2.  EKGs reviewed.  Provide as needed pain medication such as Tylenol.  Continue aspirin.  Outpatient follow-up with cardiologist.    Alcohol use disorder, severe, dependence (HCC) and Alcohol-induced mood disorder with depressive symptoms (HCC).  Present on admission: Yes. Plan: Thiamine.  Management per psychiatrist.    Cocaine abuse (HCC).  Present on admission: Yes. Plan: Counseled patient not to use cocaine.  Discussed risks of heart attack and stroke with cocaine use.  Management  per psychiatrist.    HTN (hypertension), Primary.  Present on admission: Yes. Plan: Continue home medications.    Substance induced mood disorder (HCC).  Present on admission: Yes. Plan: Management per psychiatrist.    Marijuana use.  Present on admission: Yes. Plan: Management per psychiatrist.   Thank you for consulting your hospitalist team.  We will follow along with you.  Please call us with any questions or concerns.  __________________________________________________________   DVT prophylaxis: Management is per attending physician.  Code Status:   Full Code    Marlow Baars MD [facility]  How to contact the [facility] Attending or Consulting provider 7A - 7P or covering provider during after hours 7P -7A, for this patient?   Check the care team in [facility] and look for a) attending/consulting [facility] provider listed and b) the [facility] team listed Log into [email or web address] and use [place]'s universal password to access. If you do not have the password, please contact the hospital operator. Locate the [facility] provider you are looking for under [facility] and page to a number that you can be directly reached. If you still have difficulty reaching the provider, please page the [facility] (Director on Call) for the Hospitalists listed on amion for assistance.  09/29/2020, 4:10 PM

## 2020-09-29 NOTE — Progress Notes (Signed)
D:  Patient's self inventory sheet, patient has fair sleep, sleep medication not helpful.  Good appetite, normal energy level, good concentration.  Rated depression, anxiety and hopeless #6.  Denied withdrawals.  Denied SI.  Physical problems, headaches.  Goal is me.  Plans to think.  No discharge plans. A:  Medications administered per  MD orders.  Emotional support and encouragement given patient. R:  Denied SI and HI, contracts for safety.  Denied A/V hallucinations.  Safety maintained with 15 minute checks.

## 2020-09-29 NOTE — Plan of Care (Signed)
Patient was admitted and oriented to the unit. Went to the dayroom and introduced himself to peers. Patient stayed in the dayroom and was adjusting well. No sign of distress. Patient received snack and medications and went to bed. Safety monitored as recommended.

## 2020-09-29 NOTE — Tx Team (Signed)
Initial Treatment Plan 09/29/2020 12:51 AM [name] [id number]:[id number]    PATIENT STRESSORS: Financial difficulties   Health problems   Marital or family conflict   Occupational concerns   Substance abuse     PATIENT STRENGTHS: Ability for insight  Average or above average intelligence  Communication skills    PATIENT IDENTIFIED PROBLEMS: Depression  Grief  Suicidal thoughts  Substance use  Housing issues             DISCHARGE CRITERIA:  Improved stabilization in mood, thinking, and/or behavior Motivation to continue treatment in a less acute level of care Verbal commitment to aftercare and medication compliance Withdrawal symptoms are absent or subacute and managed without 24-hour nursing intervention  PRELIMINARY DISCHARGE PLAN: Attend 12-step recovery group Outpatient therapy Placement in alternative living arrangements  PATIENT/FAMILY INVOLVEMENT: This treatment plan has been presented to and reviewed with the patient, Bruce Moreno. The patient has been given the opportunity to ask questions and make suggestions.  Olin Pia, RN 09/29/2020, 12:51 AM

## 2020-09-29 NOTE — Plan of Care (Signed)
Nurse discussed anxiety and coping skills with patient. 

## 2020-09-29 NOTE — Progress Notes (Signed)
Pt not visible on the unit much this evening, pt stated he felt about the same as coming in this evening. Pt given PRN Trazodone with HS medication    09/29/20 2300  Psych Admission Type (Psych Patients Only)  Admission Status Voluntary  Psychosocial Assessment  Patient Complaints Anxiety  Eye Contact Poor  Affect Sad;Depressed;Anxious  Speech Unremarkable  Interaction Assertive  Motor Activity Restless  Appearance/Hygiene In scrubs  Behavior Characteristics Cooperative  Mood Depressed  Thought Process  Coherency WDL  Content WDL  Delusions WDL  Perception WDL  Hallucination None reported or observed  Judgment Poor  Confusion WDL  Danger to Self  Current suicidal ideation? Denies  Self-Injurious Behavior No self-injurious ideation or behavior indicators observed or expressed   Agreement Not to Harm Self Yes  Description of Agreement verbal  Danger to Others  Danger to Others None reported or observed  Danger to Others Abnormal  Harmful Behavior to others No threats or harm toward other people  Destructive Behavior No threats or harm toward property

## 2020-09-29 NOTE — [facility] Group Notes (Signed)
Patient attended group today. 

## 2020-09-29 NOTE — [facility] Group Notes (Signed)
Relaxation and music therapy - attended 

## 2020-09-29 NOTE — [facility] Counselor (Signed)
CSW was unable to complete PSA at this time due to pt sleeping. CSW will make another attempt during pt's admission at [facility].   Fredirick Lathe, LCSWA Clinicial Social Worker [employer]

## 2020-09-29 NOTE — H&P (Addendum)
Dr. Gasper Sells is completing this note.  I was present for entirety of service; physical exam and portions of note finished by Dr. Hazle Quant  Psychiatric Admission Assessment Adult  Patient Identification: Bruce Moreno MRN:  [id number] Date of Evaluation:  09/29/2020 Chief Complaint:  MDD (major depressive disorder), recurrent episode, severe (HCC) [F33.2] Principal Diagnosis: <principal problem not specified> Diagnosis:  Active Problems:   Alcohol use disorder, severe, dependence (HCC)   Alcohol-induced mood disorder with depressive symptoms (HCC)   Cocaine abuse (HCC)   HTN (hypertension)   Substance induced mood disorder (HCC)   MDD (major depressive disorder), recurrent episode, severe (HCC)   Marijuana use  History of Present Illness:  Patient is a 50 yo male w/ hx of alcohol use disorder-severe, cocaine abuse, benzodiazapine abuse, MDD, HTN, dyslipidemia, HFrEF presents to [facility] from [facility] due to worsening depression, SI, and HI. Patient states that "truthfully, I am here because I relapsed". Patient stated this was triggered when his son died 04-05-2020("when he died, I felt like I died"). Since then, patient has had quit going to AA meetings (was going to 3-4/week before) and he fell out of contact with many of his supports for sobriety. In addition to this, patient reports additional stressor of his ex-girlfriend separating from him and preventing him from seeing his son that they conceived. This culminated in patient attempting to kill himself this weekend ("I was planning to snort a line of fentanyl and drink myself to death"). Patient's brother went to hotel patient had been in and took him to [facility]. Patient had never before this year used illicit substances including fentanyl, marijuana and crack/cocaine.  Patient states that while his HI is towards everyone ("I feel so angry some times I want to kill everyone"), patient's HI is mainly towards ex-girlfriend. Patient fears that he would have  choked the life out of his ex-girlfriend and then killed himself if he had seen her this weekend. Patient states that he has had problems with "anger management" as patient states he is quick-tempered and unable to appropriately control emotions. Patient has been previously incarcerated for 7 years for shooting a person. Patient interested in counseling for his anger and grief.    Patient has been dealing with substance use for a while now. He began drinking around the age of 58. Had never smoked weed or done drugs until this year. Patient's dad's death triggered the drinking. Patient began drinking heavily when he had an infant son die at age 55 (has lost 2 sons total). Patient did have a 3 year period of sobriety after doing medical detox and attending AA meetings. Patient states he had actual been leading group meetings during AA meetings at time. Since relapse, patient has been drinking every day and had poor decision making while drinking including driving. Patient states there are many days where he will wake up in bed and be uncertain how he got there.  Patient does endorse eating less lately due to patient's binge of drugs and drinking this past weekend but eats regularly. Patient states he has not been sleeping well due to anxiety but denies times where he has felt lack of need for sleep.   Patient's medical problems include a heart attack a few months ago, bulging disc that patient is on disability for, and recently discovered HTN. Patient also endorses he has daily pounding headaches and intermittent chest pain that worsens with exertion.   Patient states he is primarily looking for therapy/counseling. Recommended Zoloft for  antidepressant but patient was hesitant about medication as patient was concerned about sexual dysfunction side effect. Wants some time to think and is open to revisiting this tomorrow.   Patient denies EtOH withdrawal but feels that he went through cravings/withdrawal this  weekend. No tremor. No seizure or hallucinations with past withdrawal. Pulse has been a little high this AM. Last drink was Sunday at 4 AM.   Patient denies access to guns and states "if I had a gun I wouldn't be here right now".   Associated Signs/Symptoms: Depression Symptoms:  depressed mood, anhedonia, insomnia, fatigue, difficulty concentrating, hopelessness, recurrent thoughts of death, suicidal thoughts with specific plan, loss of energy/fatigue, Duration of Depression Symptoms: Greater than two weeks  (Hypo) Manic Symptoms:  Impulsivity, Irritable Mood, Anxiety Symptoms:   N/A Psychotic Symptoms:   N/A PTSD Symptoms: Had a traumatic exposure:  Loss of father when patient was [age], loss of infant son at 89, loss of son February 2022 Total Time spent with patient: I personally spent 30  minutes on the unit in direct patient care and 20 minutes in chart review and documentation. The direct patient care time included face-to-face time with the patient, reviewing the patient's chart, communicating with other professionals, and coordinating care. Greater than 50% of this time was spent in counseling or coordinating care with the patient regarding goals of hospitalization, psycho-education, and discharge planning needs.    Past Psychiatric History: Alcohol Use Disorder - Severe s/p AA meeting and detox. 3 year sobriety but relapse in February 2022 after son passed. No psychiatric medications, no individual therapy  Is the patient at risk to self? Yes.    Has the patient been a risk to self in the past 6 months? Yes.    Has the patient been a risk to self within the distant past? Yes.    Is the patient a risk to others? Yes.    Has the patient been a risk to others in the past 6 months? Yes.    Has the patient been a risk to others within the distant past? Yes.     Prior Inpatient Therapy:   [facility] Prior Outpatient Therapy:   [facility] no individual  Alcohol Screening: 1. How often  do you have a drink containing alcohol?: 4 or more times a week 2. How many drinks containing alcohol do you have on a typical day when you are drinking?: 10 or more 3. How often do you have six or more drinks on one occasion?: Daily or almost daily AUDIT-C Score: 12 4. How often during the last year have you found that you were not able to stop drinking once you had started?: Daily or almost daily 5. How often during the last year have you failed to do what was normally expected from you because of drinking?: Daily or almost daily 6. How often during the last year have you needed a first drink in the morning to get yourself going after a heavy drinking session?: Daily or almost daily 7. How often during the last year have you had a feeling of guilt of remorse after drinking?: Daily or almost daily 8. How often during the last year have you been unable to remember what happened the night before because you had been drinking?: Less than monthly 9. Have you or someone else been injured as a result of your drinking?: No 10. Has a relative or friend or a doctor or another health worker been concerned about your drinking or suggested  you cut down?: Yes, during the last year Alcohol Use Disorder Identification Test Final Score (AUDIT): 33 Alcohol Brief Interventions/Follow-up: Alcohol education/Brief advice Substance Abuse History in the last 12 months:  Yes.   Consequences of Substance Abuse: Family Consequences:  Estranged from youngest son. Homeless at this time. Blackouts:  Patient states he will sometimes wake up in bed and not certain how he got there. Previous Psychotropic Medications: No  Psychological Evaluations: No  Past Medical History:  Past Medical History:  Diagnosis Date   Congestive heart failure (CHF) (HCC)    Hypercholesteremia    Hypertension    History reviewed. No pertinent surgical history. Family History: History reviewed. No pertinent family history. Family Psychiatric   History: None per patient Tobacco Screening: Smokes minimally Social History:  Social History   Substance and Sexual Activity  Alcohol Use Yes   Alcohol/week: 4.0 standard drinks   Types: 4 Standard drinks or equivalent per week     Social History   Substance and Sexual Activity  Drug Use Yes   Types: Cocaine   Comment: smoked crack this AM     Allergies:  No Known Allergies Lab Results:  Results for orders placed or performed during the hospital encounter of 09/25/20 (from the past 48 hour(s))  POC SARS Coronavirus 2 Ag-ED - Nasal Swab     Status: None   Collection Time: 09/28/20  2:41 PM  Result Value Ref Range   SARSCOV2ONAVIRUS 2 AG NEGATIVE NEGATIVE    Comment: (NOTE) SARS-CoV-2 antigen NOT DETECTED.   Negative results are presumptive.  Negative results do not preclude SARS-CoV-2 infection and should not be used as the sole basis for treatment or other patient management decisions, including infection  control decisions, particularly in the presence of clinical signs and  symptoms consistent with COVID-19, or in those who have been in contact with the virus.  Negative results must be combined with clinical observations, patient history, and epidemiological information. The expected result is Negative.  Fact Sheet for Patients: https://www.jennings-kim.com/  Fact Sheet for Healthcare Providers: https://alexander-rogers.biz/  This test is not yet approved or cleared by the [place] FDA and  has been authorized for detection and/or diagnosis of SARS-CoV-2 by FDA under an Emergency Use Authorization (EUA).  This EUA will remain in effect (meaning this test can be used) for the duration of  the COV ID-19 declaration under Section 564(b)(1) of the Act, 21 U.S.C. section 360bbb-3(b)(1), unless the authorization is terminated or revoked sooner.      Blood Alcohol level:  Lab Results  Component Value Date   ETH 230 (H) 09/25/2020   ETH  <10 09/12/2020    Metabolic Disorder Labs:  No results found for: HGBA1C, MPG No results found for: PROLACTIN No results found for: CHOL, TRIG, HDL, CHOLHDL, VLDL, LDLCALC  Current Medications: Current Facility-Administered Medications  Medication Dose Route Frequency Provider Last Rate Last Admin   acetaminophen (TYLENOL) tablet 650 mg  650 mg Oral Q6H PRN Rankin, Shuvon B, NP   650 mg at 09/29/20 1408   alum & mag hydroxide-simeth (MAALOX/MYLANTA) 200-200-20 MG/5ML suspension 30 mL  30 mL Oral Q4H PRN Rankin, Shuvon B, NP       atorvastatin (LIPITOR) tablet 40 mg  40 mg Oral Daily Rankin, Shuvon B, NP   40 mg at 09/29/20 0911   empagliflozin (JARDIANCE) tablet 10 mg  10 mg Oral Daily Rankin, Shuvon B, NP   10 mg at 09/29/20 0911   hydrOXYzine (ATARAX/VISTARIL) tablet 25 mg  25 mg Oral TID PRN Jaclyn Shaggy, PA-C   25 mg at 09/29/20 1408   magnesium hydroxide (MILK OF MAGNESIA) suspension 30 mL  30 mL Oral Daily PRN Rankin, Shuvon B, NP       metoprolol succinate (TOPROL-XL) 24 hr tablet 100 mg  100 mg Oral BID Rankin, Shuvon B, NP   100 mg at 09/29/20 1710   [START ON 09/30/2020] pneumococcal 23 valent vaccine (PNEUMOVAX-23) injection 0.5 mL  0.5 mL Intramuscular Tomorrow-1000 Mason Jim, Amy E, MD       sacubitril-valsartan (ENTRESTO) 97-103 mg per tablet  1 tablet Oral BID Rankin, Shuvon B, NP   1 tablet at 09/29/20 1710   spironolactone (ALDACTONE) tablet 25 mg  25 mg Oral Daily Rankin, Shuvon B, NP       traZODone (DESYREL) tablet 50 mg  50 mg Oral QHS PRN Melbourne Abts W, PA-C   50 mg at 09/28/20 2138   PTA Medications: Medications Prior to Admission  Medication Sig Dispense Refill Last Dose   atorvastatin (LIPITOR) 40 MG tablet Take 40 mg by mouth daily.      furosemide (LASIX) 20 MG tablet Take 20 mg by mouth daily as needed for edema.      JARDIANCE 10 MG TABS tablet Take 10 mg by mouth daily.      metoprolol succinate (TOPROL-XL) 100 MG 24 hr tablet Take 100 mg by mouth 2 (two)  times daily. Take with or immediately following a meal.      sacubitril-valsartan (ENTRESTO) 97-103 MG Take 1 tablet by mouth 2 (two) times daily.      spironolactone (ALDACTONE) 25 MG tablet Take 25 mg by mouth daily.       Musculoskeletal: Strength & Muscle Tone: within normal limits Gait & Station: normal Patient leans: Front            Psychiatric Specialty Exam:  Presentation  General Appearance: Appropriate for Environment; Casual  Eye Contact:Good  Speech:Clear and Coherent (increased amount)  Speech Volume:-- (mildly increased)  Handedness:Right   Mood and Affect  Mood:Anxious (sad)  Affect:-- (full range and appropriate to context of conversatoin)   Thought Process  Thought Processes:Coherent  Duration of Psychotic Symptoms: No data recorded Past Diagnosis of Schizophrenia or Psychoactive disorder: No  Descriptions of Associations:Intact  Orientation:Full (Time, Place and Person)  Thought Content:-- (repetitive bordering on perseverative)  Hallucinations:Hallucinations: None Ideas of Reference:None  Suicidal Thoughts:Suicidal Thoughts: Yes, Active SI Active Intent and/or Plan: -- (prior to admission, SI with plan with high lethality (fentanyl overdose)) Homicidal Thoughts:Homicidal Thoughts: -- (none at time of exam. HI weekend prior to ED visit.) HI Active Intent and/or Plan: With Intent; With Plan; With Means to Carry Out  Sensorium  Memory:Immediate Good; Recent Good; Remote Good  Judgment:Impaired  Insight:Fair   Executive Functions  Concentration:Fair  Attention Span:Fair  Recall:Fair  Fund of Knowledge:Fair  Language:Fair   Psychomotor Activity  Psychomotor Activity: Psychomotor Activity: Normal  Assets  Assets:Communication Skills; Housing; Resilience; Other (comment) (3 years of sobriety recently)   Sleep  Sleep: Sleep: Fair   Physical Exam: Physical Exam Vitals and nursing note reviewed.  Constitutional:       Appearance: Normal appearance. He is normal weight.  HENT:     Head: Normocephalic and atraumatic.  Cardiovascular:     Rate and Rhythm: Normal rate and regular rhythm.     Pulses: Normal pulses.     Heart sounds: Normal heart sounds.  Pulmonary:     Effort: Pulmonary effort  is normal.  Neurological:     General: No focal deficit present.     Mental Status: He is oriented to person, place, and time.   Review of Systems  Respiratory:  Negative for shortness of breath.   Cardiovascular:  Positive for chest pain.  Gastrointestinal:  Negative for abdominal pain, constipation, diarrhea, heartburn, nausea and vomiting.  Neurological:  Negative for headaches. \ Pt complained of chest tingling>pain, no radiation at present although has had recently. Given recent hx MI, consulted medicine (pain was related to extremes of emtion during interview and self resolved)  Blood pressure (!) 142/99, pulse 73, temperature 97.9 F (36.6 C), temperature source Esophageal, resp. rate 18, height 5\' 6"  (1.676 m), SpO2 98 %. Body mass index is 30.96 kg/m.  Treatment Plan Summary: Daily contact with patient to assess and evaluate symptoms and progress in treatment and Medication management  Trevonn Hallum is a 50 y.o. male admitted 09/28/2020  6:55 PM for suicidal ideation.  he carries the diagnoses of alcohol use disorder. he meets criteria for major depressive disorder and complicated grief/bereavement.  I do believe that the major depressive disorder predates the relapse of alcohol use disorder given patient history - it sounds like he withdrew from AA and relapsed some time after that.   Precipitating factors leading to admission include an argument with the mother of his 108 year old hcild and a binge on EtOH and other drugs (additionally the loss of his son 6 months ago precipitated relapse); had bought a line of fentanyl he intended to use to end his life. He has no outpatient mental health care and has  never been on outpatient psychotropic medications; previously good response to AA (3 years sobriety). He was compliant with assessment but did not consent to medication today. Would prefer sertraline d/t better evidence of safety in patients with MI although any SSRI would be acceptable. Will need grief counseling and/or a support group for bereaved parents on dc.   Observation Level/Precautions:  15 minute checks  Laboratory:   Troponin  Psychotherapy:  per SW, no current outpatient regimen  Medications:    atorvastatin  40 mg Oral Daily   empagliflozin  10 mg Oral Daily   folic acid  1 mg Oral Daily   metoprolol succinate  100 mg Oral BID   multivitamin with minerals  1 tablet Oral Daily   [START ON 09/30/2020] pneumococcal 23 valent vaccine  0.5 mL Intramuscular Tomorrow-1000   sacubitril-valsartan  1 tablet Oral BID   spironolactone  25 mg Oral Daily   thiamine  100 mg Oral Daily   Or   thiamine  100 mg Intravenous Daily   Started home meds including BP, statin, etc. Additionally added daily thiamine given recent heavy EtOH use. Did not start psychotropic today per pt preference.    Consultations:  consulted medicine, await recs  Discharge Concerns:  recent HI towards girlfriend with whom he lives  Estimated LOS:5 days  Other:  Started CIWA including daily thiamine for this patient. Had some sx of withdrawal in ED, is now 4 days out from last drink which is not unheard of for late withdrawal. No hx dangerous withdrawal. Likely dc tomorrow after 3 consecutive (-) scores. Will continue daily thiamine   Physician Treatment Plan for Primary Diagnosis: <principal problem not specified> Long Term Goal(s): Improvement in symptoms so as ready for discharge  Short Term Goals: Ability to identify changes in lifestyle to reduce recurrence of condition will improve, Ability to verbalize  feelings will improve, Ability to disclose and discuss suicidal ideas, Ability to demonstrate self-control will  improve, Ability to identify and develop effective coping behaviors will improve, and Ability to identify triggers associated with substance abuse/mental health issues will improve  Physician Treatment Plan for Secondary Diagnosis: Active Problems:   Alcohol use disorder, severe, dependence (HCC)   Alcohol-induced mood disorder with depressive symptoms (HCC)   Cocaine abuse (HCC)   HTN (hypertension)   Substance induced mood disorder (HCC)   MDD (major depressive disorder), recurrent episode, severe (HCC)   Marijuana use  Long Term Goal(s): Improvement in symptoms so as ready for discharge  Short Term Goals: Ability to identify changes in lifestyle to reduce recurrence of condition will improve, Ability to verbalize feelings will improve, Ability to disclose and discuss suicidal ideas, Ability to demonstrate self-control will improve, Ability to identify and develop effective coping behaviors will improve, Ability to maintain clinical measurements within normal limits will improve, and Ability to identify triggers associated with substance abuse/mental health issues will improve  I certify that inpatient services furnished can reasonably be expected to improve the patient's condition.    Young Berry Loyola Santino 9/8/20226:45 PM

## 2020-09-29 NOTE — [facility] Group Notes (Signed)
Pt attended and contributed to group 

## 2020-09-30 ENCOUNTER — Encounter (HOSPITAL_COMMUNITY): Payer: Self-pay

## 2020-09-30 DIAGNOSIS — F1410 Cocaine abuse, uncomplicated: Secondary | ICD-10-CM

## 2020-09-30 DIAGNOSIS — R45850 Homicidal ideations: Secondary | ICD-10-CM

## 2020-09-30 DIAGNOSIS — F1094 Alcohol use, unspecified with alcohol-induced mood disorder: Secondary | ICD-10-CM

## 2020-09-30 DIAGNOSIS — I5022 Chronic systolic (congestive) heart failure: Secondary | ICD-10-CM

## 2020-09-30 DIAGNOSIS — F1994 Other psychoactive substance use, unspecified with psychoactive substance-induced mood disorder: Secondary | ICD-10-CM

## 2020-09-30 DIAGNOSIS — F1020 Alcohol dependence, uncomplicated: Secondary | ICD-10-CM

## 2020-09-30 DIAGNOSIS — F333 Major depressive disorder, recurrent, severe with psychotic symptoms: Secondary | ICD-10-CM

## 2020-09-30 DIAGNOSIS — F1290 Cannabis use, unspecified, uncomplicated: Secondary | ICD-10-CM

## 2020-09-30 DIAGNOSIS — R0789 Other chest pain: Secondary | ICD-10-CM

## 2020-09-30 DIAGNOSIS — R45851 Suicidal ideations: Secondary | ICD-10-CM

## 2020-09-30 MED ORDER — TRAZODONE HCL 100 MG PO TABS
100.0000 mg | ORAL_TABLET | Freq: Every evening | ORAL | Status: DC | PRN
  Administered 2020-09-30 – 2020-10-03 (×4): 100 mg via ORAL
  Filled 2020-09-30 (×4): qty 1

## 2020-09-30 MED ORDER — SERTRALINE HCL 50 MG PO TABS
50.0000 mg | ORAL_TABLET | Freq: Every day | ORAL | Status: DC
  Administered 2020-09-30 – 2020-10-06 (×7): 50 mg via ORAL
  Filled 2020-09-30 (×10): qty 1

## 2020-09-30 NOTE — Group Note (Signed)
Recreation Therapy Group Note   Group Topic:Stress Management  Group Date: 09/30/2020 Start Time: 0930 End Time: 0950 Facilitators: Caroll Rancher, LRT/CTRS Location: [address] Dayroom  Group Description. Meditation. LRT introduced the stress management technique of meditation.  LRT played a meditation that focused on being resilient in the face of adversity.  Patients were to listen and follow along as meditation played to fully engage.  LRT debriefed with patients.  Patients were given suggestions of ways to access meditations post d/c and encouraged to explore Youtube and other apps available on smartphones, tablets, and computers.   Affect/Mood: Appropriate   Participation Level: Minimal   Participation Quality: Independent   Behavior: Appropriate   Speech/Thought Process: Distracted   Insight: Good   Judgement: Good   Modes of Intervention: Meditation   Patient Response to Interventions:  Receptive   Education Outcome:  Acknowledges education and In group clarification offered    Clinical Observations/Individualized Feedback: Pt attended and gave minimal participation in group session.  Pt was filling out self inventory sheet while in group.   Plan: Continue to engage patient in RT group sessions 2-3x/week.   Caroll Rancher, LRT/CTRS 09/30/2020 12:27 PM

## 2020-09-30 NOTE — Progress Notes (Signed)
Progress note    09/30/20 0815  Psych Admission Type (Psych Patients Only)  Admission Status Voluntary  Psychosocial Assessment  Patient Complaints None  Eye Contact Fair  Facial Expression Animated;Anxious  Affect Appropriate to circumstance  Speech Logical/coherent  Interaction Assertive  Motor Activity Slow  Appearance/Hygiene Unremarkable  Behavior Characteristics Cooperative;Appropriate to situation  Mood Pleasant  Thought Process  Coherency WDL  Content WDL  Delusions WDL  Perception WDL  Hallucination None reported or observed  Judgment WDL  Confusion None  Danger to Self  Current suicidal ideation? Denies  Self-Injurious Behavior No self-injurious ideation or behavior indicators observed or expressed   Danger to Others  Danger to Others None reported or observed

## 2020-09-30 NOTE — Progress Notes (Signed)
Pt given PRN Trazodone and Vistaril per [facility]    09/30/20 2300  Psych Admission Type (Psych Patients Only)  Admission Status Voluntary  Psychosocial Assessment  Patient Complaints None  Eye Contact Fair  Facial Expression Animated;Anxious  Affect Appropriate to circumstance  Speech Logical/coherent  Interaction Assertive  Motor Activity Slow  Appearance/Hygiene Unremarkable  Behavior Characteristics Cooperative  Mood Pleasant  Thought Process  Coherency WDL  Content WDL  Delusions WDL  Perception WDL  Hallucination None reported or observed  Judgment WDL  Confusion None  Danger to Self  Current suicidal ideation? Denies  Self-Injurious Behavior No self-injurious ideation or behavior indicators observed or expressed   Danger to Others  Danger to Others None reported or observed

## 2020-09-30 NOTE — [facility] Group Notes (Signed)
Topic:   Due to the acuity and complex discharge plans, group was not held. Patient was provided therapeutic worksheets and asked to meet with CSW as needed. 

## 2020-09-30 NOTE — [facility] Counselor (Signed)
CSW was unable to complete PSA at this time due to pt sleeping. CSW will make another attempt during pt's admission at [facility].   Tashera Montalvo, LCSWA Clinicial Social Worker [facility]  

## 2020-09-30 NOTE — Progress Notes (Signed)
PROGRESS NOTE    Bruce Moreno  GMW:[id number] DOB: 1970-04-20 DOA: 09/28/2020 PCP: Maryelizabeth Rowan, MD     Brief Narrative:  Bruce Moreno is a 50 y.o. male with medical history significant for chronic systolic congestive heart failure (EF 25-30% by 04/2020 echocardiogram), hypertension, hyperlipidemia, cocaine abuse, alcohol abuse, former smoker, with no history of coronary artery disease or stent placement, who presents to the [facility] emergency department on 09/25/2020 with chest pain and suicidal thoughts, with recent relapse of alcohol and cocaine use.  His last cocaine use was the night before presentation.  He reports that he used a large amount of cocaine all at once because he was intending to kill himself.  Patient was also complaining of chest pain and hospitalist service was consulted.  Patient has had chest pain episodes in the past and had a cardiac catheterization at [facility] on 05/18/2020 that showed no coronary artery disease.  His current episode of chest pain began 09/23/2020, 2 days prior to presentation, and it has been intermittent.  Chest pain is located in the left chest, radiates to the left arm,is up to 10/10 at times and is characterized as sharp and shooting.  Work-up revealed that EKG were negative, troponin negative.  Patient is currently admitted at [facility].  New events last 24 hours / Subjective: Discussed with patient's RN, patient no longer complaining of chest pain overnight or today.  Vital signs remained stable with BP 135/97, pulse 78, afebrile and saturating well on room air.  Assessment & Plan:   Principal Problem:   MDD (major depressive disorder), recurrent episode, severe (HCC) Active Problems:   Alcohol use disorder, severe, dependence (HCC)   Alcohol-induced mood disorder with depressive symptoms (HCC)   Cocaine abuse (HCC)   Chronic systolic CHF (congestive heart failure) (HCC)   HTN (hypertension)   Chest pain   Substance induced mood disorder (HCC)    Marijuana use   Suicidal ideation   Homicidal ideation   Recurrent chest pain -Unlikely to be associated with coronary artery disease as patient had a negative heart cath in April 2022.  He is also had negative EKGs and negative troponin during this hospital stay.  With his reproducible left anterior chest wall chest pain, this is likely to be musculoskeletal in nature -D-dimer mildly elevated 0.6.  His Wells criteria score is 0 and pretest probability for PE is low with prevalence of 4%.  Also patient is not tachycardic, not hypoxic -Patient to follow-up with his outpatient cardiologist -Continue aspirin, Lipitor -Would recommend pain medication as needed, Tylenol preferred due to his history of substance abuse  Chronic systolic heart failure Hypertension -Previous EF 25 to 30%, continue home medications including Toprol, Entresto, spironolactone, Jardiance -Blood pressure remains stable -BNP 16.5  Suicidal ideation, major depressive disorder, substance abuse disorder -Per primary service   TRH will sign off at this time.  Recommend to continue to treat his recurrent chest pain likely to be musculoskeletal in origin with Tylenol.  Can also alternate with ibuprofen as his creatinine has been normal and blood pressure has been well controlled.  Would stay away from narcotics due to his substance abuse history.  Patient to follow-up with his outpatient cardiology at [facility].    Antimicrobials:  Anti-infectives (From admission, onward)    None        Objective: Vitals:   09/29/20 1542 09/30/20 0619 09/30/20 0620 09/30/20 1055  BP: (!) 142/99 (!) 137/94 (!) 135/97 125/89  Pulse: 73 73 78 92  Resp: 18     Temp: 97.9 F (36.6 C) 98.6 F (37 C)    TempSrc: Esophageal Oral    SpO2: 98%     Height:       No intake or output data in the 24 hours ending 09/30/20 1210 There were no vitals filed for this visit.  Data Reviewed: I have personally reviewed following labs and imaging  studies  CBC: Recent Labs  Lab 09/25/20 1500  WBC 6.7  HGB 13.9  HCT 40.8  MCV 94.2  PLT 203   Basic Metabolic Panel: Recent Labs  Lab 09/25/20 1500 09/29/20 1839  NA 138 141  K 4.0 4.3  CL 104 106  CO2 22 27  GLUCOSE 86 126*  BUN 18 19  CREATININE 1.01 1.17  CALCIUM 9.3 10.1  MG  --  2.2   GFR: Estimated Creatinine Clearance: 77.2 mL/min (by C-G formula based on SCr of 1.17 mg/dL). Liver Function Tests: No results for input(s): AST, ALT, ALKPHOS, BILITOT, PROT, ALBUMIN in the last 168 hours. No results for input(s): LIPASE, AMYLASE in the last 168 hours. No results for input(s): AMMONIA in the last 168 hours. Coagulation Profile: No results for input(s): INR, PROTIME in the last 168 hours. Cardiac Enzymes: No results for input(s): CKTOTAL, CKMB, CKMBINDEX, TROPONINI in the last 168 hours. BNP (last 3 results) No results for input(s): PROBNP in the last 8760 hours. HbA1C: No results for input(s): HGBA1C in the last 72 hours. CBG: No results for input(s): GLUCAP in the last 168 hours. Lipid Profile: No results for input(s): CHOL, HDL, LDLCALC, TRIG, CHOLHDL, LDLDIRECT in the last 72 hours. Thyroid Function Tests: No results for input(s): TSH, T4TOTAL, FREET4, T3FREE, THYROIDAB in the last 72 hours. Anemia Panel: No results for input(s): VITAMINB12, FOLATE, FERRITIN, TIBC, IRON, RETICCTPCT in the last 72 hours. Sepsis Labs: No results for input(s): PROCALCITON, LATICACIDVEN in the last 168 hours.  Recent Results (from the past 240 hour(s))  Resp Panel by RT-PCR (Flu A&B, Covid) Nasopharyngeal Swab     Status: None   Collection Time: 09/25/20 10:00 PM   Specimen: Nasopharyngeal Swab; Nasopharyngeal(NP) swabs in vial transport medium  Result Value Ref Range Status   SARS Coronavirus 2 by RT PCR NEGATIVE NEGATIVE Final    Comment: (NOTE) SARS-CoV-2 target nucleic acids are NOT DETECTED.  The SARS-CoV-2 RNA is generally detectable in upper respiratory specimens  during the acute phase of infection. The lowest concentration of SARS-CoV-2 viral copies this assay can detect is 138 copies/mL. A negative result does not preclude SARS-Cov-2 infection and should not be used as the sole basis for treatment or other patient management decisions. A negative result may occur with  improper specimen collection/handling, submission of specimen other than nasopharyngeal swab, presence of viral mutation(s) within the areas targeted by this assay, and inadequate number of viral copies(<138 copies/mL). A negative result must be combined with clinical observations, patient history, and epidemiological information. The expected result is Negative.  Fact Sheet for Patients:  BloggerCourse.com  Fact Sheet for Healthcare Providers:  SeriousBroker.it  This test is no t yet approved or cleared by the [place] FDA and  has been authorized for detection and/or diagnosis of SARS-CoV-2 by FDA under an Emergency Use Authorization (EUA). This EUA will remain  in effect (meaning this test can be used) for the duration of the COVID-19 declaration under Section 564(b)(1) of the Act, 21 U.S.C.section 360bbb-3(b)(1), unless the authorization is terminated  or revoked sooner.  Influenza A by PCR NEGATIVE NEGATIVE Final   Influenza B by PCR NEGATIVE NEGATIVE Final    Comment: (NOTE) The Xpert Xpress SARS-CoV-2/FLU/RSV plus assay is intended as an aid in the diagnosis of influenza from Nasopharyngeal swab specimens and should not be used as a sole basis for treatment. Nasal washings and aspirates are unacceptable for Xpert Xpress SARS-CoV-2/FLU/RSV testing.  Fact Sheet for Patients: BloggerCourse.com  Fact Sheet for Healthcare Providers: SeriousBroker.it  This test is not yet approved or cleared by the [place] FDA and has been authorized for detection  and/or diagnosis of SARS-CoV-2 by FDA under an Emergency Use Authorization (EUA). This EUA will remain in effect (meaning this test can be used) for the duration of the COVID-19 declaration under Section 564(b)(1) of the Act, 21 U.S.C. section 360bbb-3(b)(1), unless the authorization is terminated or revoked.  Performed at [facility], [address], [place], [place] [address]       Radiology Studies: No results found.    Scheduled Meds:  atorvastatin  40 mg Oral Daily   empagliflozin  10 mg Oral Daily   folic acid  1 mg Oral Daily   metoprolol succinate  100 mg Oral BID   multivitamin with minerals  1 tablet Oral Daily   pneumococcal 23 valent vaccine  0.5 mL Intramuscular Tomorrow-1000   sacubitril-valsartan  1 tablet Oral BID   spironolactone  25 mg Oral Daily   thiamine  100 mg Oral Daily   Or   thiamine  100 mg Intravenous Daily   Continuous Infusions:   LOS: 2 days      Time spent: 20 minutes   Noralee Stain, DO [facility] 09/30/2020, 12:10 PM   Available via Epic secure chat 7am-7pm After these hours, please refer to coverage provider listed on [email or web address]

## 2020-09-30 NOTE — Progress Notes (Signed)
Adult Psychoeducational Group Note  Date:  09/30/2020 Time:  9:40 PM  Group Topic/Focus:  Wrap-Up Group:   The focus of this group is to help patients review their daily goal of treatment and discuss progress on daily workbooks.  Participation Level:  Active  Participation Quality:  Appropriate  Affect:  Appropriate  Cognitive:  Appropriate  Insight: Appropriate  Engagement in Group:  Engaged  Modes of Intervention:  Discussion  Additional Comments:  Pt attended the evening N.A meeting and was appropriate.  Felipa Furnace 09/30/2020, 9:40 PM

## 2020-09-30 NOTE — Progress Notes (Signed)
[facility] MD Progress Note  09/30/2020 5:41 PM Bruce Moreno  MRN:  [id number] Subjective:    Reason for admission: Patient is a 50 yo male w/ hx of alcohol use disorder-severe, cocaine abuse, benzodiazapine abuse, MDD, HTN, dyslipidemia, HFrEF presents to [facility] from [facility] due to worsening depression, SI, and HI.   Chart Review, 24 hr Events: The patient's chart was reviewed and nursing notes were reviewed. The patient's case was discussed in multidisciplinary team meeting.  Per MAR: - Patient is compliant with scheduled meds. - PRNs: Trazodone for sleep, hydroxyzine for anxiety, tylenol for mild chest pain (likely MSK etiology per IM note) Per RN notes, no documented behavioral issues and is attending group. Patient slept, 7.25 hours  Information Obtained Today During Patient Interview (09/30/2020): The patient was seen and evaluated on the unit with Dr. Viviano Simas. On assessment today, patient reports that his mood is "pretty good". Patient states that he was able to talk with his roommate in unit and felt that being able to talk about his situation has really improved his mood. In addition, patient states he was agreeable to take Zoloft as discussed during H&P assessment. R/b/se were re-discussed and patient was agreeable to taking the medication stating "I will do whatever it takes to get back to myself". Patient states only present complaint was he did not sleep well last night. Patient stated that he would wake up every hour or two and toss and turn in bed. Patient asking for additional sleep medication so that he is able to sleep more appropriately. Patient states he has no other physical complaints at this time. Patient states this was the best he has felt in awhile. Patient states that today his main focus is "self-control"  Patient denies SI/HI/AVH. Patient denies paranoia, delusions, first rank symptoms. Patient denies chest pain, nausea, vomiting, headache, cough.   Principal Problem: MDD (major  depressive disorder), recurrent episode, severe (HCC) Diagnosis: Principal Problem:   MDD (major depressive disorder), recurrent episode, severe (HCC) Active Problems:   Alcohol use disorder, severe, dependence (HCC)   Alcohol-induced mood disorder with depressive symptoms (HCC)   Cocaine abuse (HCC)   Chronic systolic CHF (congestive heart failure) (HCC)   HTN (hypertension)   Chest pain   Substance induced mood disorder (HCC)   Marijuana use   Suicidal ideation   Homicidal ideation  Total Time spent with patient:  I personally spent 35 minutes on the unit in direct patient care. The direct patient care time included face-to-face time with the patient, reviewing the patient's chart, communicating with other professionals, and coordinating care. Greater than 50% of this time was spent in counseling or coordinating care with the patient regarding goals of hospitalization, psycho-education, and discharge planning needs.   Past Psychiatric History: Alcohol Use Disorder - Severe s/p AA meeting and detox. 3 year sobriety but relapse in February 2022 after son passed. No psychiatric medications, no individual therapy  Past Medical History:  Past Medical History:  Diagnosis Date   Chronic systolic CHF (congestive heart failure) (HCC)    Echo at [facility] 05/16/20: EF 25-30%, grade I diastolic dysfunction, no pulmonary hypertension.   Hypercholesteremia    Hypertension     Past Surgical History:  Procedure Laterality Date   CARDIAC CATHETERIZATION  05/18/2020   No significant coronary artery disease. Nomral right and left sided filling pressures. Low-normal cardiac output.   Family History:  Family History  Problem Relation Age of Onset   Hypertension Mother    Cancer Father  Heart attack Neg Hx    Family Psychiatric  History: Patient reports none Social History:  Social History   Substance and Sexual Activity  Alcohol Use Yes   Alcohol/week: 4.0 standard drinks   Types: 4  Standard drinks or equivalent per week   Comment: Quit from 2019-2022 but relapsed 09/2020.     Social History   Substance and Sexual Activity  Drug Use Yes   Types: Cocaine    Social History   Socioeconomic History   Marital status: Single    Spouse name: Not on file   Number of children: Not on file   Years of education: Not on file   Highest education level: Not on file  Occupational History   Not on file  Tobacco Use   Smoking status: Former    Packs/day: 0.10    Years: 0.50    Pack years: 0.05    Types: Cigarettes    Start date: 03/11/2020    Quit date: 07/22/2020    Years since quitting: 0.1   Smokeless tobacco: Never  Vaping Use   Vaping Use: Never used  Substance and Sexual Activity   Alcohol use: Yes    Alcohol/week: 4.0 standard drinks    Types: 4 Standard drinks or equivalent per week    Comment: Quit from 2019-2022 but relapsed 09/2020.   Drug use: Yes    Types: Cocaine   Sexual activity: Not Currently  Other Topics Concern   Not on file  Social History Narrative   Not on file   Social Determinants of Health   Financial Resource Strain: Not on file  Food Insecurity: Not on file  Transportation Needs: Not on file  Physical Activity: Not on file  Stress: Not on file  Social Connections: Not on file   Additional Social History:                         Sleep: Good  Appetite:  Good  Current Medications: Current Facility-Administered Medications  Medication Dose Route Frequency Provider Last Rate Last Admin   acetaminophen (TYLENOL) tablet 650 mg  650 mg Oral Q6H PRN Rankin, Shuvon B, NP   650 mg at 09/29/20 1408   alum & mag hydroxide-simeth (MAALOX/MYLANTA) 200-200-20 MG/5ML suspension 30 mL  30 mL Oral Q4H PRN Rankin, Shuvon B, NP       atorvastatin (LIPITOR) tablet 40 mg  40 mg Oral Daily Rankin, Shuvon B, NP   40 mg at 09/30/20 0814   empagliflozin (JARDIANCE) tablet 10 mg  10 mg Oral Daily Rankin, Shuvon B, NP   10 mg at 09/30/20 4235    folic acid (FOLVITE) tablet 1 mg  1 mg Oral Daily Cinderella, Margaret A   1 mg at 09/30/20 3614   hydrOXYzine (ATARAX/VISTARIL) tablet 25 mg  25 mg Oral TID PRN Jaclyn Shaggy, PA-C   25 mg at 09/29/20 1408   LORazepam (ATIVAN) tablet 1-4 mg  1-4 mg Oral Q1H PRN Cinderella, Margaret A       Or   LORazepam (ATIVAN) injection 1-4 mg  1-4 mg Intravenous Q1H PRN Cinderella, Margaret A       magnesium hydroxide (MILK OF MAGNESIA) suspension 30 mL  30 mL Oral Daily PRN Rankin, Shuvon B, NP       metoprolol succinate (TOPROL-XL) 24 hr tablet 100 mg  100 mg Oral BID Rankin, Shuvon B, NP   100 mg at 09/30/20 1729   multivitamin with minerals tablet  1 tablet  1 tablet Oral Daily Cinderella, Margaret A   1 tablet at 09/30/20 1610   pneumococcal 23 valent vaccine (PNEUMOVAX-23) injection 0.5 mL  0.5 mL Intramuscular Tomorrow-1000 Mason Jim, Amy E, MD       sacubitril-valsartan (ENTRESTO) 97-103 mg per tablet  1 tablet Oral BID Rankin, Shuvon B, NP   1 tablet at 09/30/20 1729   sertraline (ZOLOFT) tablet 50 mg  50 mg Oral Daily Park Pope, MD   50 mg at 09/30/20 1729   spironolactone (ALDACTONE) tablet 25 mg  25 mg Oral Daily Rankin, Shuvon B, NP       thiamine tablet 100 mg  100 mg Oral Daily Cinderella, Margaret A   100 mg at 09/30/20 9604   Or   thiamine (B-1) injection 100 mg  100 mg Intravenous Daily Cinderella, Margaret A       traZODone (DESYREL) tablet 100 mg  100 mg Oral QHS PRN Park Pope, MD        Lab Results:  Results for orders placed or performed during the hospital encounter of 09/28/20 (from the past 48 hour(s))  Troponin I (High Sensitivity)     Status: None   Collection Time: 09/29/20  6:39 PM  Result Value Ref Range   Troponin I (High Sensitivity) 3 <18 ng/L    Comment: (NOTE) Elevated high sensitivity troponin I (hsTnI) values and significant  changes across serial measurements may suggest ACS but many other  chronic and acute conditions are known to elevate hsTnI results.   Refer to the "Links" section for chest pain algorithms and additional  guidance. Performed at [facility], [address], [place], [place] [address]   Brain natriuretic peptide     Status: None   Collection Time: 09/29/20  6:39 PM  Result Value Ref Range   B Natriuretic Peptide 16.5 0.0 - 100.0 pg/mL    Comment: Performed at [facility], [address] [place], [place] [address]  Magnesium     Status: None   Collection Time: 09/29/20  6:39 PM  Result Value Ref Range   Magnesium 2.2 1.7 - 2.4 mg/dL    Comment: Performed at [facility], [address], [place], [place] [address]  Basic metabolic panel     Status: Abnormal   Collection Time: 09/29/20  6:39 PM  Result Value Ref Range   Sodium 141 135 - 145 mmol/L   Potassium 4.3 3.5 - 5.1 mmol/L   Chloride 106 98 - 111 mmol/L   CO2 27 22 - 32 mmol/L   Glucose, Bld 126 (H) 70 - 99 mg/dL    Comment: Glucose reference range applies only to samples taken after fasting for at least 8 hours.   BUN 19 6 - 20 mg/dL   Creatinine, Ser 6.21 0.61 - 1.24 mg/dL   Calcium 30.8 8.9 - 65.7 mg/dL   GFR, Estimated >84 >69 mL/min    Comment: (NOTE) Calculated using the CKD-EPI Creatinine Equation (2021)    Anion gap 8 5 - 15    Comment: Performed at [facility], [address] [place], [place] [address]  D-dimer, quantitative     Status: Abnormal   Collection Time: 09/29/20  6:39 PM  Result Value Ref Range   D-Dimer, Quant 0.65 (H) 0.00 - 0.50 ug/mL-FEU    Comment: (NOTE) At the manufacturer cut-off value of 0.5 g/mL FEU, this assay has a negative predictive value of 95-100%.This assay is intended for use  in conjunction with a clinical pretest probability (PTP) assessment model to exclude pulmonary embolism (PE) and deep venous thrombosis (DVT) in outpatients suspected of PE or DVT. Results should be correlated with clinical presentation. Performed at [facility], [address], [place], [place] [address]     Blood Alcohol level:  Lab Results  Component Value Date   ETH 230 (H) 09/25/2020   ETH <10 09/12/2020    Metabolic Disorder Labs: No results found for: HGBA1C, MPG No results found for: PROLACTIN No results found for: CHOL, TRIG, HDL, CHOLHDL, VLDL, LDLCALC  Physical Findings:  Musculoskeletal: Strength & Muscle Tone: within normal limits Gait & Station: normal Patient leans: N/A  Psychiatric Specialty Exam:  Presentation  General Appearance: Appropriate for Environment; Casual  Eye Contact:Good  Speech:Clear and Coherent  Speech Volume:Normal  Handedness:Right   Mood and Affect  Mood:Euthymic  Affect:Congruent   Thought Process  Thought Processes:Coherent  Descriptions of Associations:Intact  Orientation:Full (Time, Place and Person)  Thought Content:Logical  History of Schizophrenia/Schizoaffective disorder:No  Duration of Psychotic Symptoms:No data recorded Hallucinations:Hallucinations: None  Ideas of Reference:None  Suicidal Thoughts:Suicidal Thoughts: No SI Active Intent and/or Plan: -- (prior to admission, SI with plan with high lethality (fentanyl overdose))  Homicidal Thoughts:Homicidal Thoughts: No HI Active Intent and/or Plan: With Intent; With Plan; With Means to Carry Out   Sensorium  Memory:Immediate Good; Recent Good; Remote Good  Judgment:Intact  Insight:Fair   Executive Functions  Concentration:Fair  Attention Span:Fair  Recall:Fair  Fund of Knowledge:Fair  Language:Fair   Psychomotor Activity  Psychomotor Activity:Psychomotor Activity: Normal   Assets  Assets:Communication Skills; Desire for Improvement; Resilience; Physical Health   Sleep  Sleep:Sleep: Good    Physical Exam: Physical Exam Vitals and nursing note reviewed.  Constitutional:      Appearance: Normal appearance. He is normal weight.  HENT:     Head:  Normocephalic and atraumatic.  Pulmonary:     Effort: Pulmonary effort is normal.  Neurological:     General: No focal deficit present.     Mental Status: He is oriented to person, place, and time.   Review of Systems  Respiratory:  Negative for shortness of breath.   Cardiovascular:  Negative for chest pain.  Gastrointestinal:  Negative for abdominal pain, constipation, diarrhea, heartburn, nausea and vomiting.  Neurological:  Negative for headaches.  Blood pressure (!) 139/94, pulse 75, temperature 98.6 F (37 C), temperature source Oral, resp. rate 18, height 5\' 6"  (1.676 m), SpO2 98 %. Body mass index is 30.96 kg/m.   Treatment Plan Summary: Daily contact with patient to assess and evaluate symptoms and progress in treatment and Medication management  ASSESSMENT Patient is a 50 yo male w/ hx of alcohol use disorder-severe, cocaine abuse, benzodiazapine abuse, MDD, HTN, dyslipidemia, HFrEF presents to [facility] from [facility] due to worsening depression, SI, and HI. Patient states he's doing better. Working on "self-control" today. Requesting sleep medication and antidepressant.  PLAN Safety and Monitoring:             -- Involuntary admission to inpatient psychiatric unit for safety, stabilization and treatment             -- Daily contact with patient to assess and evaluate symptoms and progress in treatment             -- Patient's case to be discussed in multi-disciplinary team meeting             -- Observation Level : q15 minute checks             --  Vital signs:  q12 hours             -- Precautions: suicide, elopement, and assault   2. Psychiatric Diagnoses and Treatment:  MDD-severe w/o psychosis Substance Induced Mood Disorder -Start Zoloft 50 mg qd, r/b/se discussed with patient and patient agreeable to trial of medication -Encourage group therapy attendance   Insomnia -Increase trazodone to 100 mg qhs prn for sleep  3. Medical Problems being addressed Alcohol  withdrawal -CIWA protocol in place, has not required prn Ativan during admission  Recurrent chest pain (Per IM Consult Note, appreciate recs) -Unlikely to be associated with coronary artery disease as patient had a negative heart cath in April 2022.  He is also had negative EKGs and negative troponin during this hospital stay.  With his reproducible left anterior chest wall chest pain, this is likely to be musculoskeletal in nature -D-dimer mildly elevated 0.6.  His Wells criteria score is 0 and pretest probability for PE is low with prevalence of 4%.  Also patient is not tachycardic, not hypoxic -Patient to follow-up with his outpatient cardiologist -Continue aspirin, Lipitor -Would recommend pain medication as needed, Tylenol preferred due to his history of substance abuse   Chronic systolic heart failure (Per IM Consult Note, appreciate recs) Hypertension -Previous EF 25 to 30%, continue home medications including Toprol, Entresto, spironolactone, Jardiance -Blood pressure remains stable -BNP 16.5  4. Discharge Planning:  -- Social work and case management to assist with discharge planning and identification of hospital follow-up needs prior to discharge -- Estimated LOS: 5-7 days -- Discharge Concerns: Need to establish a safety plan; Medication compliance and effectiveness -- Discharge Goals: Return home with outpatient referrals for mental health follow-up including medication management/psychotherapy   Park PopeAndrew Reyna Lorenzi, MD 09/30/2020, 5:41 PM

## 2020-09-30 NOTE — [facility] Group Notes (Signed)
Adult Psychoeducational Group Note  Date:  09/30/2020 Time:  5:09 PM  Group Topic/Focus:  Personal Choices and Values:   The focus of this group is to help patients assess and explore the importance of values in their lives, how their values affect their decisions, how they express their values and what opposes their expression.  Participation Level:  Active  Participation Quality:  Appropriate  Affect:  Appropriate  Cognitive:  Alert  Insight: Appropriate  Engagement in Group:  Engaged  Modes of Intervention:  Socialization  Additional Comments:  Pt attended group and had great participation. Gave great advice to other in group as well.   Beckie Busing 09/30/2020, 5:09 PM

## 2020-09-30 NOTE — Plan of Care (Signed)
  Problem: Education: Goal: Knowledge of [facility] Education information/materials will improve Outcome: Progressing Goal: Emotional status will improve Outcome: Progressing Goal: Verbalization of understanding the information provided will improve Outcome: Progressing   

## 2020-09-30 NOTE — BH IP Treatment Plan (Signed)
Interdisciplinary Treatment and Diagnostic Plan Update  09/30/2020 Time of Session: 10:45am Bruce Moreno MRN: [id number]  Principal Diagnosis: MDD (major depressive disorder), recurrent episode, severe ([place])  Secondary Diagnoses: Principal Problem:   MDD (major depressive disorder), recurrent episode, severe ([place]) Active Problems:   Alcohol use disorder, severe, dependence ([place])   Alcohol-induced mood disorder with depressive symptoms ([place])   Cocaine abuse ([place])   Chronic systolic CHF (congestive heart failure) ([facility])   HTN (hypertension)   Chest pain   Substance induced mood disorder ([place])   Marijuana use   Suicidal ideation   Homicidal ideation   Current Medications:  Current Facility-Administered Medications  Medication Dose Route Frequency Provider Last Rate Last Admin   acetaminophen (TYLENOL) tablet 650 mg  650 mg Oral Q6H PRN Rankin, Shuvon B, NP   650 mg at 09/29/20 1408   alum & mag hydroxide-simeth (MAALOX/MYLANTA) 200-200-20 MG/5ML suspension 30 mL  30 mL Oral Q4H PRN Rankin, Shuvon B, NP       atorvastatin (LIPITOR) tablet 40 mg  40 mg Oral Daily Rankin, Shuvon B, NP   40 mg at 09/30/20 0814   empagliflozin (JARDIANCE) tablet 10 mg  10 mg Oral Daily Rankin, Shuvon B, NP   10 mg at 95/09/32 6712   folic acid (FOLVITE) tablet 1 mg  1 mg Oral Daily Cinderella, Margaret A   1 mg at 09/30/20 4580   hydrOXYzine (ATARAX/VISTARIL) tablet 25 mg  25 mg Oral TID PRN Pres[place]illa Sours, PA-C   25 mg at 09/29/20 1408   LORazepam (ATIVAN) tablet 1-4 mg  1-4 mg Oral Q1H PRN Cinderella, Margaret A       Or   LORazepam (ATIVAN) injection 1-4 mg  1-4 mg Intravenous Q1H PRN Cinderella, Margaret A       magnesium hydroxide (MILK OF MAGNESIA) suspension 30 mL  30 mL Oral Daily PRN Rankin, Shuvon B, NP       metoprolol succinate (TOPROL-XL) 24 hr tablet 100 mg  100 mg Oral BID Rankin, Shuvon B, NP   100 mg at 09/30/20 1056   multivitamin with minerals tablet 1 tablet  1 tablet Oral Daily  Cinderella, Margaret A   1 tablet at 09/30/20 0814   pneumococcal 23 valent vaccine (PNEUMOVAX-23) injection 0.5 mL  0.5 mL Intramuscular Tomorrow-1000 Nelda Marseille, Amy E, MD       sacubitril-valsartan (ENTRESTO) 97-103 mg per tablet  1 tablet Oral BID Rankin, Shuvon B, NP   1 tablet at 09/30/20 9983   spironolactone (ALDACTONE) tablet 25 mg  25 mg Oral Daily Rankin, Shuvon B, NP       thiamine tablet 100 mg  100 mg Oral Daily Cinderella, Margaret A   100 mg at 09/30/20 3825   Or   thiamine (B-1) injection 100 mg  100 mg Intravenous Daily Cinderella, Margaret A       traZODone (DESYREL) tablet 50 mg  50 mg Oral QHS PRN Margorie John W, PA-C   50 mg at 09/29/20 2153   PTA Medications: Medications Prior to Admission  Medication Sig Dispense Refill Last Dose   atorvastatin (LIPITOR) 40 MG tablet Take 40 mg by mouth daily.      furosemide (LASIX) 20 MG tablet Take 20 mg by mouth daily as needed for edema.      JARDIANCE 10 MG TABS tablet Take 10 mg by mouth daily.      metoprolol succinate (TOPROL-XL) 100 MG 24 hr tablet Take 100 mg by mouth 2 (two) times daily.  Take with or immediately following a meal.      sacubitril-valsartan (ENTRESTO) 97-103 MG Take 1 tablet by mouth 2 (two) times daily.      spironolactone (ALDACTONE) 25 MG tablet Take 25 mg by mouth daily.       Patient Stressors: Financial difficulties   Health problems   Marital or family conflict   Occupational concerns   Substance abuse    Patient Strengths: Ability for insight  Average or above average intelligence  Communication skills   Treatment Modalities: Medication Management, Group therapy, Case management,  1 to 1 session with clinician, Psychoeducation, Recreational therapy.   Physician Treatment Plan for Primary Diagnosis: MDD (major depressive disorder), recurrent episode, severe ([place]) Long Term Goal(s): Improvement in symptoms so as ready for discharge   Short Term Goals: Ability to identify changes in lifestyle  to reduce recurrence of condition will improve Ability to verbalize feelings will improve Ability to disclose and discuss suicidal ideas Ability to demonstrate self-control will improve Ability to identify and develop effective coping behaviors will improve Ability to maintain clinical measurements within normal limits will improve Ability to identify triggers associated with substance abuse/mental health issues will improve  Medication Management: Evaluate patient's response, side effects, and tolerance of medication regimen.  Therapeutic Interventions: 1 to 1 sessions, Unit Group sessions and Medication administration.  Evaluation of Outcomes: Not Met  Physician Treatment Plan for Secondary Diagnosis: Principal Problem:   MDD (major depressive disorder), recurrent episode, severe ([place]) Active Problems:   Alcohol use disorder, severe, dependence ([place])   Alcohol-induced mood disorder with depressive symptoms ([facility])   Cocaine abuse ([place])   Chronic systolic CHF (congestive heart failure) ([facility])   HTN (hypertension)   Chest pain   Substance induced mood disorder ([place])   Marijuana use   Suicidal ideation   Homicidal ideation  Long Term Goal(s): Improvement in symptoms so as ready for discharge   Short Term Goals: Ability to identify changes in lifestyle to reduce recurrence of condition will improve Ability to verbalize feelings will improve Ability to disclose and discuss suicidal ideas Ability to demonstrate self-control will improve Ability to identify and develop effective coping behaviors will improve Ability to maintain clinical measurements within normal limits will improve Ability to identify triggers associated with substance abuse/mental health issues will improve     Medication Management: Evaluate patient's response, side effects, and tolerance of medication regimen.  Therapeutic Interventions: 1 to 1 sessions, Unit Group sessions and Medication  administration.  Evaluation of Outcomes: Not Met   RN Treatment Plan for Primary Diagnosis: MDD (major depressive disorder), recurrent episode, severe ([place]) Long Term Goal(s): Knowledge of disease and therapeutic regimen to maintain health will improve  Short Term Goals: Ability to remain free from injury will improve, Ability to verbalize frustration and anger appropriately will improve, Ability to demonstrate self-control, Ability to participate in decision making will improve, Ability to verbalize feelings will improve, Ability to disclose and discuss suicidal ideas, and Ability to identify and develop effective coping behaviors will improve  Medication Management: RN will administer medications as ordered by provider, will assess and evaluate patient's response and provide education to patient for prescribed medication. RN will report any adverse and/or side effects to prescribing provider.  Therapeutic Interventions: 1 on 1 counseling sessions, Psychoeducation, Medication administration, Evaluate responses to treatment, Monitor vital signs and CBGs as ordered, Perform/monitor CIWA, COWS, AIMS and Fall Risk screenings as ordered, Perform wound care treatments as ordered.  Evaluation of Outcomes: Not Met  LCSW Treatment Plan for Primary Diagnosis: MDD (major depressive disorder), recurrent episode, severe ([place]) Long Term Goal(s): Safe transition to appropriate next level of care at discharge, Engage patient in therapeutic group addressing interpersonal concerns.  Short Term Goals: Engage patient in aftercare planning with referrals and resources, Increase social support, Increase ability to appropriately verbalize feelings, Increase emotional regulation, Facilitate acceptance of mental health diagnosis and concerns, Facilitate patient progression through stages of change regarding substance use diagnoses and concerns, Identify triggers associated with mental health/substance abuse issues, and  Increase skills for wellness and recovery  Therapeutic Interventions: Assess for all discharge needs, 1 to 1 time with Social worker, Explore available resources and support systems, Assess for adequacy in community support network, Educate family and significant other(s) on suicide prevention, Complete Psychosocial Assessment, Interpersonal group therapy.  Evaluation of Outcomes: Not Met   Progress in Treatment: Attending groups: Yes. Participating in groups: Yes. Taking medication as prescribed: Yes. Toleration medication: Yes. Family/Significant other contact made: No, will contact:  Will be contacted upon patient consent Patient understands diagnosis: Yes. Discussing patient identified problems/goals with staff: Yes. Medical problems stabilized or resolved: Yes. Denies suicidal/homicidal ideation: Yes. Issues/concerns per patient self-inventory: No.  New problem(s) identified: No, Describe:  no new problems identified  New Short Term/Long Term Goal(s): detox, medication management for mood stabilization; elimination of SI thoughts; development of comprehensive mental wellness/sobriety plan   Patient Goals:  Patient states that he would like to work on his sobriety and work on coping with his anger.   Discharge Plan or Barriers: Patient recently admitted. CSW will continue to follow and assess for appropriate referrals and possible discharge planning.    Reason for Continuation of Hospitalization: Anxiety Depression Medication stabilization Withdrawal symptoms  Estimated Length of Stay: 3-5 days   Scribe for Treatment Team: Zachery Conch, LCSW 09/30/2020 11:51 AM

## 2020-10-01 NOTE — Progress Notes (Signed)
   10/01/20 1534  Psych Admission Type (Psych Patients Only)  Admission Status Voluntary  Psychosocial Assessment  Patient Complaints Anxiety;Depression  Eye Contact Fair  Facial Expression Animated;Anxious  Affect Appropriate to circumstance  Speech Logical/coherent  Interaction Assertive  Motor Activity Slow  Appearance/Hygiene Unremarkable  Behavior Characteristics Cooperative  Mood Pleasant  Thought Process  Coherency WDL  Content WDL  Delusions WDL  Perception WDL  Hallucination None reported or observed  Judgment WDL  Confusion None  Danger to Self  Current suicidal ideation? Denies  Self-Injurious Behavior No self-injurious ideation or behavior indicators observed or expressed   Agreement Not to Harm Self Yes  Description of Agreement verbal  Danger to Others  Danger to Others None reported or observed  Danger to Others Abnormal  Harmful Behavior to others No threats or harm toward other people  Destructive Behavior No threats or harm toward property

## 2020-10-01 NOTE — Progress Notes (Signed)
[facility] MD Progress Note  10/01/2020 5:40 PM Jerrol Helmers  MRN:  [id number] Subjective:    Reason for admission: Patient is a 50 yo male w/ hx of alcohol use disorder-severe, cocaine abuse, benzodiazapine abuse, MDD, HTN, dyslipidemia, HFrEF presents to [facility] from [facility] due to worsening depression, SI, and HI.   Chart Review, 24 hr Events: The patient's chart was reviewed and nursing notes were reviewed. The patient's case was discussed in multidisciplinary team meeting.  Per MAR: - Patient is compliant with scheduled meds. - PRNs: Trazodone for sleep, hydroxyzine for anxiety Per RN notes, no documented behavioral issues and is attending group. Patient slept, 6.75 hours  Information Obtained Today During Patient Interview (09/30/2020): The patient was seen and evaluated on the unit with Dr. Viviano Simas. On assessment today, patient reports that his mood is "the best I've felt in a while". Patient states that he finally understands what others have been trying to tell him for years, that he was missing therapy as a crucial component to his recovery.  He slept well last night, and he has made breakthroughs in group sessions. He denies any side effects of the Zoloft.  He also denies chest pain at the moment, but does not want to talk about it, as the chest pain seems to come on when he gets anxious.  Patient states he has no other physical complaints at this time. Patient states that while yesterday his main focus was "self-control," today his focus is "train of thought." Patient denies SI/HI/AVH. Patient denies paranoia, delusions, first rank symptoms.    Principal Problem: MDD (major depressive disorder), recurrent episode, severe (HCC) Diagnosis: Principal Problem:   MDD (major depressive disorder), recurrent episode, severe (HCC) Active Problems:   Alcohol use disorder, severe, dependence (HCC)   Alcohol-induced mood disorder with depressive symptoms (HCC)   Cocaine abuse (HCC)   Chronic systolic CHF  (congestive heart failure) (HCC)   HTN (hypertension)   Chest pain   Substance induced mood disorder (HCC)   Marijuana use   Suicidal ideation   Homicidal ideation  Total Time spent with patient:  I personally spent 35 minutes on the unit in direct patient care. The direct patient care time included face-to-face time with the patient, reviewing the patient's chart, communicating with other professionals, and coordinating care. Greater than 50% of this time was spent in counseling or coordinating care with the patient regarding goals of hospitalization, psycho-education, and discharge planning needs.   Past Psychiatric History: Alcohol Use Disorder - Severe s/p AA meeting and detox. 3 year sobriety but relapse in February 2022 after son passed. No psychiatric medications, no individual therapy  Past Medical History:  Past Medical History:  Diagnosis Date   Chronic systolic CHF (congestive heart failure) (HCC)    Echo at [facility] 05/16/20: EF 25-30%, grade I diastolic dysfunction, no pulmonary hypertension.   Hypercholesteremia    Hypertension     Past Surgical History:  Procedure Laterality Date   CARDIAC CATHETERIZATION  05/18/2020   No significant coronary artery disease. Nomral right and left sided filling pressures. Low-normal cardiac output.   Family History:  Family History  Problem Relation Age of Onset   Hypertension Mother    Cancer Father    Heart attack Neg Hx    Family Psychiatric  History: Patient reports none Social History:  Social History   Substance and Sexual Activity  Alcohol Use Yes   Alcohol/week: 4.0 standard drinks   Types: 4 Standard drinks or equivalent per week  Comment: Quit from 2019-2022 but relapsed 09/2020.     Social History   Substance and Sexual Activity  Drug Use Yes   Types: Cocaine    Social History   Socioeconomic History   Marital status: Single    Spouse name: Not on file   Number of children: Not on file   Years of education:  Not on file   Highest education level: Not on file  Occupational History   Not on file  Tobacco Use   Smoking status: Former    Packs/day: 0.10    Years: 0.50    Pack years: 0.05    Types: Cigarettes    Start date: 03/11/2020    Quit date: 07/22/2020    Years since quitting: 0.1   Smokeless tobacco: Never  Vaping Use   Vaping Use: Never used  Substance and Sexual Activity   Alcohol use: Yes    Alcohol/week: 4.0 standard drinks    Types: 4 Standard drinks or equivalent per week    Comment: Quit from 2019-2022 but relapsed 09/2020.   Drug use: Yes    Types: Cocaine   Sexual activity: Not Currently  Other Topics Concern   Not on file  Social History Narrative   Not on file   Social Determinants of Health   Financial Resource Strain: Not on file  Food Insecurity: Not on file  Transportation Needs: Not on file  Physical Activity: Not on file  Stress: Not on file  Social Connections: Not on file   Additional Social History:     Sleep: Good  Appetite:  Good  Current Medications: Current Facility-Administered Medications  Medication Dose Route Frequency Provider Last Rate Last Admin   acetaminophen (TYLENOL) tablet 650 mg  650 mg Oral Q6H PRN Rankin, Shuvon B, NP   650 mg at 09/29/20 1408   alum & mag hydroxide-simeth (MAALOX/MYLANTA) 200-200-20 MG/5ML suspension 30 mL  30 mL Oral Q4H PRN Rankin, Shuvon B, NP       atorvastatin (LIPITOR) tablet 40 mg  40 mg Oral Daily Rankin, Shuvon B, NP   40 mg at 10/01/20 0900   empagliflozin (JARDIANCE) tablet 10 mg  10 mg Oral Daily Rankin, Shuvon B, NP   10 mg at 10/01/20 0900   folic acid (FOLVITE) tablet 1 mg  1 mg Oral Daily Cinderella, Margaret A   1 mg at 10/01/20 0900   hydrOXYzine (ATARAX/VISTARIL) tablet 25 mg  25 mg Oral TID PRN Jaclyn Shaggy, PA-C   25 mg at 09/30/20 2156   LORazepam (ATIVAN) tablet 1-4 mg  1-4 mg Oral Q1H PRN Cinderella, Margaret A       Or   LORazepam (ATIVAN) injection 1-4 mg  1-4 mg Intravenous Q1H PRN  Cinderella, Margaret A       magnesium hydroxide (MILK OF MAGNESIA) suspension 30 mL  30 mL Oral Daily PRN Rankin, Shuvon B, NP       metoprolol succinate (TOPROL-XL) 24 hr tablet 100 mg  100 mg Oral BID Rankin, Shuvon B, NP   100 mg at 10/01/20 1037   multivitamin with minerals tablet 1 tablet  1 tablet Oral Daily Cinderella, Margaret A   1 tablet at 10/01/20 0900   pneumococcal 23 valent vaccine (PNEUMOVAX-23) injection 0.5 mL  0.5 mL Intramuscular Tomorrow-1000 Mason Jim, Amy E, MD       sacubitril-valsartan (ENTRESTO) 97-103 mg per tablet  1 tablet Oral BID Rankin, Shuvon B, NP   1 tablet at 10/01/20 0900   sertraline (  ZOLOFT) tablet 50 mg  50 mg Oral Daily Park PopeJi, Andrew, MD   50 mg at 10/01/20 0900   spironolactone (ALDACTONE) tablet 25 mg  25 mg Oral Daily Rankin, Shuvon B, NP       thiamine tablet 100 mg  100 mg Oral Daily Cinderella, Margaret A   100 mg at 10/01/20 0900   Or   thiamine (B-1) injection 100 mg  100 mg Intravenous Daily Cinderella, Margaret A       traZODone (DESYREL) tablet 100 mg  100 mg Oral QHS PRN Park PopeJi, Andrew, MD   100 mg at 09/30/20 2156    Lab Results:  Results for orders placed or performed during the hospital encounter of 09/28/20 (from the past 48 hour(s))  Troponin I (High Sensitivity)     Status: None   Collection Time: 09/29/20  6:39 PM  Result Value Ref Range   Troponin I (High Sensitivity) 3 <18 ng/L    Comment: (NOTE) Elevated high sensitivity troponin I (hsTnI) values and significant  changes across serial measurements may suggest ACS but many other  chronic and acute conditions are known to elevate hsTnI results.  Refer to the "Links" section for chest pain algorithms and additional  guidance. Performed at [facility], [address] [place], [place] [address]   Brain natriuretic peptide     Status: None   Collection Time: 09/29/20  6:39 PM  Result Value Ref Range   B Natriuretic Peptide 16.5 0.0 - 100.0 pg/mL    Comment:  Performed at [facility], [address] [place], [place] [address]  Magnesium     Status: None   Collection Time: 09/29/20  6:39 PM  Result Value Ref Range   Magnesium 2.2 1.7 - 2.4 mg/dL    Comment: Performed at [facility], [address] [place], [place] [address]  Basic metabolic panel     Status: Abnormal   Collection Time: 09/29/20  6:39 PM  Result Value Ref Range   Sodium 141 135 - 145 mmol/L   Potassium 4.3 3.5 - 5.1 mmol/L   Chloride 106 98 - 111 mmol/L   CO2 27 22 - 32 mmol/L   Glucose, Bld 126 (H) 70 - 99 mg/dL    Comment: Glucose reference range applies only to samples taken after fasting for at least 8 hours.   BUN 19 6 - 20 mg/dL   Creatinine, Ser 9.141.17 0.61 - 1.24 mg/dL   Calcium 78.210.1 8.9 - 95.610.3 mg/dL   GFR, Estimated >21>60 >30>60 mL/min    Comment: (NOTE) Calculated using the CKD-EPI Creatinine Equation (2021)    Anion gap 8 5 - 15    Comment: Performed at [facility], [address] [place], [place] [address]  D-dimer, quantitative     Status: Abnormal   Collection Time: 09/29/20  6:39 PM  Result Value Ref Range   D-Dimer, Quant 0.65 (H) 0.00 - 0.50 ug/mL-FEU    Comment: (NOTE) At the manufacturer cut-off value of 0.5 g/mL FEU, this assay has a negative predictive value of 95-100%.This assay is intended for use in conjunction with a clinical pretest probability (PTP) assessment model to exclude pulmonary embolism (PE) and deep venous thrombosis (DVT) in outpatients suspected of PE or DVT. Results should be correlated with clinical presentation. Performed at [facility], [address] [place], [place] [address]     Blood Alcohol level:  Lab Results  Component Value Date   [facility]/2020   ETH <10 09/12/2020    Metabolic Disorder Labs: No results found for: HGBA1C, MPG No results found for: PROLACTIN No results found for: CHOL, TRIG, HDL, CHOLHDL, VLDL,  LDLCALC  Physical Findings:  Musculoskeletal: Strength & Muscle Tone: within normal limits Gait & Station: normal Patient leans: N/A  Psychiatric Specialty Exam:  Presentation  General Appearance: Appropriate for Environment; Well Groomed  Eye Contact:Good  Speech:Clear and Coherent; Normal Rate  Speech Volume:Normal  Handedness:Right   Mood and Affect  Mood:Euthymic (Best I've felt in a long time)  Affect:Congruent; Appropriate   Thought Process  Thought Processes:Coherent; Goal Directed; Linear  Descriptions of Associations:Intact  Orientation:Full (Time, Place and Person)  Thought Content:Logical  History of Schizophrenia/Schizoaffective disorder:No  Duration of Psychotic Symptoms:No data recorded Hallucinations:Hallucinations: None  Ideas of Reference:None  Suicidal Thoughts:Suicidal Thoughts: No  Homicidal Thoughts:Homicidal Thoughts: No   Sensorium  Memory:Immediate Good; Recent Good; Remote Good  Judgment:Good  Insight:Good   Executive Functions  Concentration:Good  Attention Span:Good  Recall:Good  Fund of Knowledge:Good  Language:Good   Psychomotor Activity  Psychomotor Activity:Psychomotor Activity: Normal   Assets  Assets:Communication Skills; Desire for Improvement; Resilience; Physical Health   Sleep  Sleep:Sleep: Good Number of Hours of Sleep: 6.75    Physical Exam: Physical Exam Vitals and nursing note reviewed.  Constitutional:      Appearance: Normal appearance. He is normal weight.     Comments: Freshly shaven  HENT:     Head: Normocephalic and atraumatic.  Pulmonary:     Effort: Pulmonary effort is normal.  Neurological:     General: No focal deficit present.     Mental Status: He is oriented to person, place, and time.   Review of Systems  Respiratory:  Negative for shortness of breath.   Cardiovascular:  Negative for chest pain.  Gastrointestinal:  Negative for abdominal pain, constipation,  diarrhea, heartburn, nausea and vomiting.  Neurological:  Negative for headaches.  Blood pressure 112/87, pulse 81, temperature 97.8 F (36.6 C), temperature source Oral, resp. rate 18, height 5\' 6"  (1.676 m), SpO2 98 %. Body mass index is 30.96 kg/m.   Treatment Plan Summary: Daily contact with patient to assess and evaluate symptoms and progress in treatment and Medication management  ASSESSMENT Patient is a 50 yo male w/ hx of alcohol use disorder-severe, cocaine abuse, benzodiazapine abuse, MDD, HTN, dyslipidemia, HFrEF presents to [facility] from [facility] due to worsening depression, SI, and HI. Patient states he's really well, tolerating current medication regimen well. Working on "train of thought" today.  PLAN Safety and Monitoring:             -- Involuntary admission to inpatient psychiatric unit for safety, stabilization and treatment             -- Daily contact with patient to assess and evaluate symptoms and progress in treatment             -- Patient's case to be discussed in multi-disciplinary team meeting             -- Observation Level : q15 minute checks             -- Vital signs:  q12 hours             -- Precautions: suicide, elopement, and assault   2. Psychiatric Diagnoses and Treatment:  MDD-severe w/o psychosis Substance Induced Mood Disorder -Continue Zoloft 50 mg qd  -Advised to inform MD or RN if his mood becomes  elevated. -Continue group therapy attendance   Insomnia -Continue trazodone 100 mg qhs prn for sleep  3. Medical Problems being addressed Alcohol withdrawal -CIWA protocol in place, has not required prn Ativan during admission  Recurrent chest pain (Per IM Consult Note, appreciate recs) -Unlikely to be associated with coronary artery disease as patient had a negative heart cath in April 2022.  He is also had negative EKGs and negative troponin during this hospital stay.  With his reproducible left anterior chest wall chest pain, this is likely to be  musculoskeletal in nature.  Per IM recs: -D-dimer mildly elevated 0.6.  His Wells criteria score is 0 and pretest probability for PE is low with prevalence of 4%.  Also patient is not tachycardic, not hypoxic -Patient to follow-up with his outpatient cardiologist -Continue aspirin, Lipitor -Would recommend pain medication as needed, Tylenol preferred due to his history of substance abuse   Chronic systolic heart failure (Per IM Consult Note, appreciate recs) Hypertension -Previous EF 25 to 30%, continue home medications including Toprol, Entresto, spironolactone, Jardiance -Blood pressure remains stable -BNP 16.5  4. Discharge Planning:  -- Social work and case management to assist with discharge planning and identification of hospital follow-up needs prior to discharge -- Estimated LOS: 5-7 days -- Discharge Concerns: Need to establish a safety plan; Medication compliance and effectiveness -- Discharge Goals: Return home with outpatient referrals for mental health follow-up including medication management/psychotherapy   Lamar Sprinkles, MD 10/01/2020, 5:40 PM

## 2020-10-01 NOTE — Group Note (Signed)
LCSW Group Therapy Note  Group Date: 10/01/2020 Start Time: 1100 End Time: 1200   Type of Therapy and Topic:  Group Therapy: Anger Cues and Responses  Participation Level:  Active   Description of Group:   In this group, patients learned how to recognize the physical, cognitive, emotional, and behavioral responses they have to anger-provoking situations.  They identified a recent time they became angry and how they reacted.  They analyzed how their reaction was possibly beneficial and how it was possibly unhelpful.  The group discussed a variety of healthier coping skills that could help with such a situation in the future.  Focus was placed on how helpful it is to recognize the underlying emotions to our anger, because working on those can lead to a more permanent solution as well as our ability to focus on the important rather than the urgent.  Therapeutic Goals: Patients will remember their last incident of anger and how they felt emotionally and physically, what their thoughts were at the time, and how they behaved. Patients will identify how their behavior at that time worked for them, as well as how it worked against them. Patients will explore possible new behaviors to use in future anger situations. Patients will learn that anger itself is normal and cannot be eliminated, and that healthier reactions can assist with resolving conflict rather than worsening situations.  Summary of Patient Progress:  [place] was active during the group. He stated that if he wakes up happy, everything is good, but if he does not wake u happy, he takes a deep breath and will drink alcohol to calm down. he shared a recent occurrence wherein feeling  He demonstrated good insight into the subject matter, was respectful of peers, and participated throughout the entire session.  Therapeutic Modalities:   Cognitive Behavioral Therapy    Burnard Bunting 10/01/2020  12:49 PM

## 2020-10-01 NOTE — Progress Notes (Signed)
   10/01/20 2241  Psych Admission Type (Psych Patients Only)  Admission Status Voluntary  Psychosocial Assessment  Patient Complaints Insomnia  Eye Contact Fair  Facial Expression Animated  Affect Appropriate to circumstance  Speech Logical/coherent  Interaction Assertive  Motor Activity Other (Comment) (WDL)  Behavior Characteristics Appropriate to situation  Mood Pleasant  Thought Process  Coherency WDL  Content WDL  Delusions None reported or observed  Perception WDL  Hallucination None reported or observed  Judgment Poor  Confusion None  Danger to Self  Current suicidal ideation? Denies  Self-Injurious Behavior No self-injurious ideation or behavior indicators observed or expressed   Agreement Not to Harm Self Yes  Description of Agreement verbal  Danger to Others  Danger to Others None reported or observed  Danger to Others Abnormal  Harmful Behavior to others No threats or harm toward other people  Destructive Behavior No threats or harm toward property

## 2020-10-01 NOTE — [facility] Suicide Risk Assessment (Signed)
[facility] INPATIENT:  Family/Significant Other Suicide Prevention Education  Suicide Prevention Education:  Patient Refusal for Family/Significant Other Suicide Prevention Education: The patient Bruce Moreno has refused to provide written consent for family/significant other to be provided Family/Significant Other Suicide Prevention Education during admission and/or prior to discharge.  Physician notified.  SPE completed with pt, as pt refused to consent to family contact. SPI pamphlet provided to pt and pt was encouraged to share information with support network, ask questions, and talk about any concerns relating to SPE. Pt denies access to guns/firearms and verbalized understanding of information provided. Mobile Crisis information also provided to pt.    Rona Ravens, MSW, LCSW 10/01/2020, 3:21 PM
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
## 2020-10-01 NOTE — [facility] Group Notes (Signed)
The focus of this group is to help patients establish daily goals to achieve during treatment and discuss how the patient can incorporate goal setting into their daily lives to aide in recovery. Pt stated that self control was his goal for today.

## 2020-10-01 NOTE — Progress Notes (Signed)
Adult Psychoeducational Group Note  Date:  10/01/2020 Time:  9:19 PM  Group Topic/Focus:  Wrap-Up Group:   The focus of this group is to help patients review their daily goal of treatment and discuss progress on daily workbooks.  Participation Level:  Active  Participation Quality:  Appropriate  Affect:  Appropriate  Cognitive:  Appropriate  Insight: Appropriate  Engagement in Group:  Engaged  Modes of Intervention:  Activity  Additional Comments:  Patient said his day was a 10. His goal for today was self control. He achieved his goal for today. The goal he thought to be most helpful was thought pattern.  Charna Busman Long 10/01/2020, 9:19 PM

## 2020-10-02 NOTE — Progress Notes (Signed)
[facility] Group Notes:  (Nursing/MHT/Case Management/Adjunct)  Date:  10/02/2020  Time:  2000  Type of Therapy:   wrap up group  Participation Level:  Active  Participation Quality:  Appropriate, Attentive, Sharing, and Supportive  Affect:  Appropriate and Excited  Cognitive:  Alert  Insight:  Improving  Engagement in Group:  Engaged  Modes of Intervention:  Clarification, Education, and Support  Summary of Progress/Problems:Positive thinking and positive change were discussed. Pt reports enjoying watching movies and laughing today. Pt plans on having a change in mindset, more positive was as appropriate as he could explain it . Pt is grateful for his two kids.   Marcille Buffy 10/02/2020, 8:44 PM

## 2020-10-02 NOTE — [facility] Group Notes (Signed)
[facility] Group Notes:  (Nursing/MHT/Case Management/Adjunct)  Date:  10/02/2020  Time:  10:01 AM  Type of Therapy:  Group Therapy  Participation Level:  Active  Participation Quality:  Appropriate  Affect:  Appropriate  Cognitive:  Appropriate  Insight:  Appropriate  Engagement in Group:  Engaged  Modes of Intervention:  Discussion  Summary of Progress/Problems:Pt was engaged in group.  Jaquita Rector 10/02/2020, 10:01 AM

## 2020-10-02 NOTE — Group Note (Signed)
[facility] LCSW Group Therapy Note  10/02/2020  10:00-11:00AM  Type of Therapy and Topic:  Group Therapy:  Building Supports  Participation Level:  Active   Description of Group:  Patients in this group were introduced to the idea of adding a variety of healthy supports to address the various needs in their lives.  Different types of support were defined and described, and patients were asked to act out what each type could be.  Patients discussed what additional healthy supports could be helpful in their recovery and wellness after discharge in order to prevent future hospitalizations.   An emphasis was placed on following up with the discharge plan when they leave the hospital in order to continue becoming healthier and happier.  Therapeutic Goals: 1)  demonstrate the importance of adding supports  2)  discuss 4 definitions of support  3)  identify the patient's current level of healthy support and   4)  elicit commitments to add one healthy support   Summary of Patient Progress:  The patient expressed full comprehension of the concepts presented, and agreed that there is a need to add more supports.  The patient indicated one healthy support that could be helpful if added would be himself opening up more to others.  He said initially that the only barrier to him achieving his goal of sobriety and being there for his baby son is himself.  He said he needs to stay focused on himself and change his attitude and friends as needed.  He admitted he is scared he will fail again.  He said the people in the hospital (peers) have helped him a lot and thanked them.  Therapeutic Modalities:   Motivational Interviewing Brief Solution-Focused Therapy  Lynnell Chad

## 2020-10-02 NOTE — Progress Notes (Addendum)
   10/02/20 0900  Psych Admission Type (Psych Patients Only)  Admission Status Voluntary  Psychosocial Assessment  Patient Complaints None  Eye Contact Fair  Facial Expression Animated  Affect Appropriate to circumstance  Speech Logical/coherent  Interaction Assertive  Motor Activity Other (Comment) (WDL)  Appearance/Hygiene Unremarkable  Behavior Characteristics Cooperative;Appropriate to situation  Mood Pleasant  Thought Process  Coherency WDL  Content WDL  Delusions None reported or observed  Perception WDL  Hallucination None reported or observed  Judgment Poor  Confusion None  Danger to Self  Current suicidal ideation? Denies  Self-Injurious Behavior No self-injurious ideation or behavior indicators observed or expressed   Agreement Not to Harm Self Yes  Description of Agreement verbal  Danger to Others  Danger to Others None reported or observed  Danger to Others Abnormal  Harmful Behavior to others No threats or harm toward other people  Destructive Behavior No threats or harm toward property   D. Pt presented as friendly upon initial approach, reported having slept "like a baby last night". Per pt's self inventory, pt rated his depression, hopelessness and anxiety a 3/3/5, respectively. Pt reported that his goal was "to stay focused and keep working on myself". Pt has been visible in the milieu interacting appropriately with peers and staff, and observed attending groups.Pt currently denies SI/HI and AVH   A. Labs and vitals monitored. Pt given and educated on medications. Pt supported emotionally and encouraged to express concerns and ask questions.   R. Pt remains safe with 15 minute checks. Will continue POC.

## 2020-10-02 NOTE — [facility] Group Notes (Signed)
[facility] Group Notes:  (Nursing/MHT/Case Management/Adjunct)  Date:  10/02/2020  Time:  2:35 PM  Type of Therapy:  Psychoeducational Skills  Participation Level:  Active  Participation Quality:  Appropriate  Affect:  Appropriate  Cognitive:  Appropriate  Insight:  Appropriate  Engagement in Group:  Engaged  Modes of Intervention:  Education  Summary of Progress/Problems:Pt was engaged in group.  Jaquita Rector 10/02/2020, 2:35 PM

## 2020-10-02 NOTE — Progress Notes (Addendum)
[facility]  10/02/2020 7:47 AM Donna ChristenDouglas Newcombe  MRN:  [id number] Subjective:    Reason for admission: Patient is a 50 yo male w/ hx of alcohol use disorder-severe, cocaine abuse, benzodiazapine abuse, MDD, HTN, dyslipidemia, HFrEF [facility] depression, SI, and HI.   Chart Review, 24 hr Events: The patient's chart was reviewed and nursing notes were reviewed. The patient's case was discussed in multidisciplinary team meeting.  Per MAR: - Patient is compliant with scheduled meds. - PRNs: Trazodone for sleep, hydroxyzine for anxiety Per RN notes, no documented behavioral issues and is attending group. Patient slept, 6.75 hours  Information Obtained Today During Patient Interview (10/02/2020): Patient states he's "progressing well". Patient states he is able to sleep well and appropriately eating. Patient denies physical complaint at this time. Patient states that he is on 3rd day of emotional control and feels he is able to more appropriately manage his emotions. Patient states that he has had many problems with "anger management" in the past and that he recalls his uncle telling him that he still is emotionally labile even when sober for 3 years. Patient recognizes this now and states he is taking steps towards improving. Patient states he feels that inpatient has been beneficial and feels he still needs a few days before he feels less labile  Patient denies SI/HI/AVH, paranoia, delusions, first rank symptoms.   Principal Problem: MDD (major depressive disorder), recurrent episode, severe (HCC) Diagnosis: Principal Problem:   MDD (major depressive disorder), recurrent episode, severe (HCC) Active Problems:   Alcohol use disorder, severe, dependence (HCC)   Alcohol-induced mood disorder with depressive symptoms (HCC)   Cocaine abuse (HCC)   Chronic systolic CHF (congestive heart failure) (HCC)   HTN (hypertension)   Chest pain   Substance induced  mood disorder (HCC)   Marijuana use   Suicidal ideation   Homicidal ideation  Total Time spent with patient:  I personally spent 35 minutes on the unit in direct patient care. The direct patient care time included face-to-face time with the patient, reviewing the patient's chart, communicating with other professionals, and coordinating care. Greater than 50% of this time was spent in counseling or coordinating care with the patient regarding goals of hospitalization, psycho-education, and discharge planning needs.   Past Psychiatric History: Alcohol Use Disorder - Severe s/p AA meeting and detox. 3 year sobriety but relapse in February 2022 after son passed. No psychiatric medications, no individual therapy  Past Medical History:  Past Medical History:  Diagnosis Date   Chronic systolic CHF (congestive heart failure) (HCC)    [facility]: EF 25-30%, grade I diastolic dysfunction, no pulmonary hypertension.   Hypercholesteremia    Hypertension     Past Surgical History:  Procedure Laterality Date   CARDIAC CATHETERIZATION  05/18/2020   No significant coronary artery disease. Nomral right and left sided filling pressures. Low-normal cardiac output.   Family History:  Family History  Problem Relation Age of Onset   Hypertension Mother    Cancer Father    Heart attack Neg Hx    Family Psychiatric  History: Patient reports none Social History:  Social History   Substance and Sexual Activity  Alcohol Use Yes   Alcohol/week: 4.0 standard drinks   Types: 4 Standard drinks or equivalent per week   Comment: Quit from 2019-2022 but relapsed 09/2020.     Social History   Substance and Sexual Activity  Drug Use Yes  Types: Cocaine    Social History   Socioeconomic History   Marital status: Single    Spouse name: Not on file   Number of children: Not on file   Years of education: Not on file   Highest education level: Not on file  Occupational History   Not on file   Tobacco Use   Smoking status: Former    Packs/day: 0.10    Years: 0.50    Pack years: 0.05    Types: Cigarettes    Start date: 03/11/2020    Quit date: 07/22/2020    Years since quitting: 0.1   Smokeless tobacco: Never  Vaping Use   Vaping Use: Never used  Substance and Sexual Activity   Alcohol use: Yes    Alcohol/week: 4.0 standard drinks    Types: 4 Standard drinks or equivalent per week    Comment: Quit from 2019-2022 but relapsed 09/2020.   Drug use: Yes    Types: Cocaine   Sexual activity: Not Currently  Other Topics Concern   Not on file  Social History Narrative   Not on file   Social Determinants of Health   Financial Resource Strain: Not on file  Food Insecurity: Not on file  Transportation Needs: Not on file  Physical Activity: Not on file  Stress: Not on file  Social Connections: Not on file   Additional Social History:     Sleep: Good  Appetite:  Good  Current Medications: Current Facility-Administered Medications  Medication Dose Route Frequency Provider Last Rate Last Admin   acetaminophen (TYLENOL) tablet 650 mg  650 mg Oral Q6H PRN Rankin, Shuvon B, NP   650 mg at 09/29/20 1408   alum & mag hydroxide-simeth (MAALOX/MYLANTA) 200-200-20 MG/5ML suspension 30 mL  30 mL Oral Q4H PRN Rankin, Shuvon B, NP       atorvastatin (LIPITOR) tablet 40 mg  40 mg Oral Daily Rankin, Shuvon B, NP   40 mg at 10/01/20 0900   empagliflozin (JARDIANCE) tablet 10 mg  10 mg Oral Daily Rankin, Shuvon B, NP   10 mg at 10/01/20 0900   folic acid (FOLVITE) tablet 1 mg  1 mg Oral Daily Cinderella, Margaret A   1 mg at 10/01/20 0900   hydrOXYzine (ATARAX/VISTARIL) tablet 25 mg  25 mg Oral TID PRN Jaclyn Shaggy, PA-C   25 mg at 10/01/20 2137   LORazepam (ATIVAN) tablet 1-4 mg  1-4 mg Oral Q1H PRN Cinderella, Margaret A       Or   LORazepam (ATIVAN) injection 1-4 mg  1-4 mg Intravenous Q1H PRN Cinderella, Margaret A       magnesium hydroxide (MILK OF MAGNESIA) suspension 30 mL  30  mL Oral Daily PRN Rankin, Shuvon B, NP       metoprolol succinate (TOPROL-XL) 24 hr tablet 100 mg  100 mg Oral BID Rankin, Shuvon B, NP   100 mg at 10/01/20 2137   multivitamin with minerals tablet 1 tablet  1 tablet Oral Daily Cinderella, Margaret A   1 tablet at 10/01/20 0900   pneumococcal 23 valent vaccine (PNEUMOVAX-23) injection 0.5 mL  0.5 mL Intramuscular Tomorrow-1000 Mason Jim, Amy E, MD       sacubitril-valsartan (ENTRESTO) 97-103 mg per tablet  1 tablet Oral BID Rankin, Shuvon B, NP   1 tablet at 10/01/20 1843   sertraline (ZOLOFT) tablet 50 mg  50 mg Oral Daily Park Pope, MD   50 mg at 10/01/20 0900   thiamine tablet 100 mg  100 mg Oral Daily Cinderella, Margaret A   100 mg at 10/01/20 0900   Or   thiamine (B-1) injection 100 mg  100 mg Intravenous Daily Cinderella, Margaret A       traZODone (DESYREL) tablet 100 mg  100 mg Oral QHS PRN Park Pope, MD   100 mg at 10/01/20 2138    Lab Results:  No results found for this or any previous visit (from the past 48 hour(s)).   Blood Alcohol level:  Lab Results  Component Value Date   ETH 230 (H) 09/25/2020   ETH <10 09/12/2020    Metabolic Disorder Labs: No results found for: HGBA1C, MPG No results found for: PROLACTIN No results found for: CHOL, TRIG, HDL, CHOLHDL, VLDL, LDLCALC  Physical Findings:  Musculoskeletal: Strength & Muscle Tone: within normal limits Gait & Station: normal Patient leans: N/A  Psychiatric Specialty Exam:  Presentation  General Appearance: Appropriate for Environment; Well Groomed  Eye Contact:Good  Speech:Clear and Coherent; Normal Rate  Speech Volume:Normal  Handedness:Right   Mood and Affect  Mood:Euthymic (Best I've felt in a long time)  Affect:Congruent; Appropriate   Thought Process  Thought Processes:Coherent; Goal Directed; Linear  Descriptions of Associations:Intact  Orientation:Full (Time, Place and Person)  Thought Content:Logical  History of  Schizophrenia/Schizoaffective disorder:No  Duration of Psychotic Symptoms:No data recorded Hallucinations:Hallucinations: None  Ideas of Reference:None  Suicidal Thoughts:Suicidal Thoughts: No  Homicidal Thoughts:Homicidal Thoughts: No   Sensorium  Memory:Immediate Good; Recent Good; Remote Good  Judgment:Good  Insight:Good   Executive Functions  Concentration:Good  Attention Span:Good  Recall:Good  Fund of Knowledge:Good  Language:Good   Psychomotor Activity  Psychomotor Activity:Psychomotor Activity: Normal   Assets  Assets:Communication Skills; Desire for Improvement; Resilience; Physical Health   Sleep  Sleep:Sleep: Good Number of Hours of Sleep: 6.75    Physical Exam: Physical Exam Vitals and nursing note reviewed.  Constitutional:      Appearance: Normal appearance. He is normal weight.     Comments: Freshly shaven  HENT:     Head: Normocephalic and atraumatic.  Pulmonary:     Effort: Pulmonary effort is normal.  Neurological:     General: No focal deficit present.     Mental Status: He is oriented to person, place, and time.   Review of Systems  Respiratory:  Negative for shortness of breath.   Cardiovascular:  Negative for chest pain.  Gastrointestinal:  Negative for abdominal pain, constipation, diarrhea, heartburn, nausea and vomiting.  Neurological:  Negative for headaches.  Blood pressure 111/70, pulse 83, temperature 97.8 F (36.6 C), temperature source Oral, resp. rate 18, height 5\' 6"  (1.676 m), SpO2 100 %. Body mass index is 30.96 kg/m.   Treatment Plan Summary: Daily contact with patient to assess and evaluate symptoms and progress in treatment and Medication management  ASSESSMENT Patient is a 50 yo male w/ hx of alcohol use disorder-severe, cocaine abuse, benzodiazapine abuse, MDD, HTN, dyslipidemia, HFrEF presents to [facility] from [facility] due to worsening depression, SI, and HI. Patient states he's really well, tolerating current  medication regimen well. Working on "train of thought" today.  PLAN Safety and Monitoring:             -- Involuntary admission to inpatient psychiatric unit for safety, stabilization and treatment             -- Daily contact with patient to assess and evaluate symptoms and progress in treatment             -- Patient's  case to be discussed in multi-disciplinary team meeting             -- Observation Level : q15 minute checks             -- Vital signs:  q12 hours             -- Precautions: suicide, elopement, and assault   2. Psychiatric Diagnoses and Treatment:  MDD-severe w/o psychosis Substance Induced Mood Disorder -Continue Zoloft 50 mg qd  -Advised to inform MD or RN if his mood becomes elevated. -Continue group therapy attendance   Insomnia -Continue trazodone 100 mg qhs prn for sleep  3. Medical Problems being addressed Alcohol withdrawal -CIWA protocol in place, has not required prn Ativan during admission  Recurrent chest pain (Per IM Consult Note, appreciate recs) -Unlikely to be associated with coronary artery disease as patient had a negative heart cath in April 2022.  He is also had negative EKGs and negative troponin during this hospital stay.  With his reproducible left anterior chest wall chest pain, this is likely to be musculoskeletal in nature.  Per IM recs: -D-dimer mildly elevated 0.6.  His Wells criteria score is 0 and pretest probability for PE is low with prevalence of 4%.  Also patient is not tachycardic, not hypoxic -Patient to follow-up with his outpatient cardiologist -Continue aspirin, Lipitor -Would recommend pain medication as needed, Tylenol preferred due to his history of substance abuse   Chronic systolic heart failure (Per IM Consult Note, appreciate recs) Hypertension -Previous EF 25 to 30%, continue home medications including Toprol, Entresto, spironolactone, Jardiance -Blood pressure remains stable -BNP 16.5  4. Discharge Planning:  --  Social work and case management to assist with discharge planning and identification of hospital follow-up needs prior to discharge -- Estimated LOS: 5-7 days -- Discharge Concerns: Need to establish a safety plan; Medication compliance and effectiveness -- Discharge Goals: Return home with outpatient referrals for mental health follow-up including medication management/psychotherapy   Park Pope, MD 10/02/2020, 7:47 AM

## 2020-10-03 NOTE — [facility] Group Notes (Signed)
Bruce Moreno attended group this morning. The focus of this group is to educate the patient on the purpose and policies of crisis stabilization and provide a format to answer questions about their admission.  The group details unit policies and expectations of patients while admitted.

## 2020-10-03 NOTE — Progress Notes (Signed)
[facility] MD Progress Note  10/03/2020 1:55 PM Bruce Moreno  MRN:  [id number] Subjective:    Reason for admission: Patient is a 50 yo male w/ hx of alcohol use disorder-severe, cocaine abuse, benzodiazapine abuse, MDD, HTN, dyslipidemia, HFrEF presents to [facility] from [facility] due to worsening depression, SI, and HI.   Chart Review, 24 hr Events: The patient's chart was reviewed and nursing notes were reviewed. The patient's case was discussed in multidisciplinary team meeting.  Per MAR: - Patient is compliant with scheduled meds. - PRNs: Trazodone for sleep, hydroxyzine for anxiety Per RN notes, no documented behavioral issues and is attending group. Patient slept, 6.75 hours  Information Obtained Today During Patient Interview (10/02/2020): Patient was seen and evaluated with Dr. Gasper Sells today.  Patient states he's not doing as well today.  Patient states that he is admitted remains well and his grandmother, uncle, son-3 days and this has been very troubling to patient has all 3 of individuals have died.  Patient states that he is working on his emotions as his goal for today.  Patient states that he has felt a bit overwhelmed due to his drinking as of recently deceased son.  Patient states that he did not eat as much for breakfast today and went back to bed after breakfast due to this.  Patient forced himself to go to day room to socialize and talk about his emotions and the impact that it has had on his day.  Patient states that the other patients are currently his support system while he is managing and working on himself.  Patient states that he is interested in a residential treatment facility and is currently working with Child psychotherapist on this.  Patient states that this incident Altrini with his son has not increased any SI/HI that he "does not now" if he would have drank and he treatment of this son outside of the unit.  Patient states that he has had intermittent mild chest pain since waking up and  states that this is likely due to his emotional state rather than a cardiac etiology.  Counseled patient that while his medication could be contributing to the vividness of restraints, it could also be related to him processing his loss and emotional needs had a more intense level while inpatient.  Patient is agreeable to continue scheduled medications at this time.  Patient denies SI/HI/AVH, paranoia, delusions, first rank symptoms.   Principal Problem: MDD (major depressive disorder), recurrent episode, severe (HCC) Diagnosis: Principal Problem:   MDD (major depressive disorder), recurrent episode, severe (HCC) Active Problems:   Alcohol use disorder, severe, dependence (HCC)   Alcohol-induced mood disorder with depressive symptoms (HCC)   Cocaine abuse (HCC)   Chronic systolic CHF (congestive heart failure) (HCC)   HTN (hypertension)   Chest pain   Substance induced mood disorder (HCC)   Marijuana use   Suicidal ideation   Homicidal ideation  Total Time spent with patient:  I personally spent 35 minutes on the unit in direct patient care. The direct patient care time included face-to-face time with the patient, reviewing the patient's chart, communicating with other professionals, and coordinating care. Greater than 50% of this time was spent in counseling or coordinating care with the patient regarding goals of hospitalization, psycho-education, and discharge planning needs.   Past Psychiatric History: Alcohol Use Disorder - Severe s/p AA meeting and detox. 3 year sobriety but relapse in February 2022 after son passed. No psychiatric medications, no individual therapy  Past Medical History:  Past Medical History:  Diagnosis Date   Chronic systolic CHF (congestive heart failure) (HCC)    Echo at [facility]/16/[facility]: EF 25-30%, grade I diastolic dysfunction, no pulmonary hypertension.   Hypercholesteremia    Hypertension     Past Surgical History:  Procedure Laterality Date   CARDIAC  CATHETERIZATION  05/18/2020   No significant coronary artery disease. Nomral right and left sided filling pressures. Low-normal cardiac output.   Family History:  Family History  Problem Relation Age of Onset   Hypertension Mother    Cancer Father    Heart attack Neg Hx    Family Psychiatric  History: Patient reports none Social History:  Social History   Substance and Sexual Activity  Alcohol Use Yes   Alcohol/week: 4.0 standard drinks   Types: 4 Standard drinks or equivalent per week   Comment: Quit from 2019-2022 but relapsed 09/2020.     Social History   Substance and Sexual Activity  Drug Use Yes   Types: Cocaine    Social History   Socioeconomic History   Marital status: Single    Spouse name: Not on file   Number of children: Not on file   Years of education: Not on file   Highest education level: Not on file  Occupational History   Not on file  Tobacco Use   Smoking status: Former    Packs/day: 0.10    Years: 0.50    Pack years: 0.05    Types: Cigarettes    Start date: 03/11/2020    Quit date: 07/22/2020    Years since quitting: 0.2   Smokeless tobacco: Never  Vaping Use   Vaping Use: Never used  Substance and Sexual Activity   Alcohol use: Yes    Alcohol/week: 4.0 standard drinks    Types: 4 Standard drinks or equivalent per week    Comment: Quit from 2019-2022 but relapsed 09/2020.   Drug use: Yes    Types: Cocaine   Sexual activity: Not Currently  Other Topics Concern   Not on file  Social History Narrative   Not on file   Social Determinants of Health   Financial Resource Strain: Not on file  Food Insecurity: Not on file  Transportation Needs: Not on file  Physical Activity: Not on file  Stress: Not on file  Social Connections: Not on file   Additional Social History:     Sleep: Good  Appetite:  Good  Current Medications: Current Facility-Administered Medications  Medication Dose Route Frequency Provider Last Rate Last Admin    acetaminophen (TYLENOL) tablet 650 mg  650 mg Oral Q6H PRN Rankin, Shuvon B, NP   650 mg at 09/29/20 1408   alum & mag hydroxide-simeth (MAALOX/MYLANTA) 200-200-20 MG/5ML suspension 30 mL  30 mL Oral Q4H PRN Rankin, Shuvon B, NP       atorvastatin (LIPITOR) tablet 40 mg  40 mg Oral Daily Rankin, Shuvon B, NP   40 mg at 10/03/20 0823   empagliflozin (JARDIANCE) tablet 10 mg  10 mg Oral Daily Rankin, Shuvon B, NP   10 mg at 10/03/20 8882   folic acid (FOLVITE) tablet 1 mg  1 mg Oral Daily Cinderella, Margaret A   1 mg at 10/03/20 0824   hydrOXYzine (ATARAX/VISTARIL) tablet 25 mg  25 mg Oral TID PRN Jaclyn Shaggy, PA-C   25 mg at 10/02/20 2127   magnesium hydroxide (MILK OF MAGNESIA) suspension 30 mL  30 mL Oral Daily PRN Rankin, Shuvon B, NP  metoprolol succinate (TOPROL-XL) 24 hr tablet 100 mg  100 mg Oral BID Rankin, Shuvon B, NP   100 mg at 10/03/20 1210   multivitamin with minerals tablet 1 tablet  1 tablet Oral Daily Cinderella, Margaret A   1 tablet at 10/03/20 0824   pneumococcal 23 valent vaccine (PNEUMOVAX-23) injection 0.5 mL  0.5 mL Intramuscular Tomorrow-1000 Mason JimSingleton, Amy E, MD       sacubitril-valsartan (ENTRESTO) 97-103 mg per tablet  1 tablet Oral BID Rankin, Shuvon B, NP   1 tablet at 10/02/20 2127   sertraline (ZOLOFT) tablet 50 mg  50 mg Oral Daily Park PopeJi, Calistro Rauf, MD   50 mg at 10/03/20 [id number]   thiamine tablet 100 mg  100 mg Oral Daily Cinderella, Margaret A   100 mg at 10/03/20 [id number]   Or   thiamine (B-1) injection 100 mg  100 mg Intravenous Daily Cinderella, Margaret A       traZODone (DESYREL) tablet 100 mg  100 mg Oral QHS PRN Park PopeJi, Kahlil Cowans, MD   100 mg at 10/02/20 2127    Lab Results:  No results found for this or any previous visit (from the past 48 hour(s)).   Blood Alcohol level:  Lab Results  Component Value Date   ETH 230 (H) 09/25/2020   ETH <10 09/12/2020    Metabolic Disorder Labs: No results found for: HGBA1C, MPG No results found for: PROLACTIN No results  found for: CHOL, TRIG, HDL, CHOLHDL, VLDL, LDLCALC  Physical Findings:  Musculoskeletal: Strength & Muscle Tone: within normal limits Gait & Station: normal Patient leans: N/A  Psychiatric Specialty Exam:  Presentation  General Appearance: Appropriate for Environment; Casual  Eye Contact:Good  Speech:Slow; Clear and Coherent  Speech Volume:Normal  Handedness:Right   Mood and Affect  Mood:Depressed  Affect:Appropriate; Congruent   Thought Process  Thought Processes:Coherent; Goal Directed  Descriptions of Associations:Intact  Orientation:Full (Time, Place and Person)  Thought Content:Logical  History of Schizophrenia/Schizoaffective disorder:No  Duration of Psychotic Symptoms:No data recorded Hallucinations:Hallucinations: None  Ideas of Reference:None  Suicidal Thoughts:Suicidal Thoughts: No  Homicidal Thoughts:Homicidal Thoughts: No   Sensorium  Memory:Immediate Good; Recent Good; Remote Good  Judgment:Good  Insight:Good   Executive Functions  Concentration:Good  Attention Span:Good  Recall:Good  Fund of Knowledge:Good  Language:Good   Psychomotor Activity  Psychomotor Activity:Psychomotor Activity: Decreased   Assets  Assets:Communication Skills; Desire for Improvement; Physical Health   Sleep  Sleep:Sleep: Good Number of Hours of Sleep: 6.5    Physical Exam: Physical Exam Vitals and nursing note reviewed.  Constitutional:      Appearance: Normal appearance. He is normal weight.     Comments: Freshly shaven  HENT:     Head: Normocephalic and atraumatic.  Pulmonary:     Effort: Pulmonary effort is normal.  Neurological:     General: No focal deficit present.     Mental Status: He is oriented to person, place, and time.   Review of Systems  Respiratory:  Negative for shortness of breath.   Cardiovascular:  Negative for chest pain.  Gastrointestinal:  Negative for abdominal pain, constipation, diarrhea, heartburn,  nausea and vomiting.  Neurological:  Negative for headaches.  Blood pressure 130/83, pulse 85, temperature 97.8 F (36.6 C), temperature source Oral, resp. rate 20, height 5\' 6"  (1.676 m), SpO2 100 %. Body mass index is 30.96 kg/m.   Treatment Plan Summary: Daily contact with patient to assess and evaluate symptoms and progress in treatment and Medication management  ASSESSMENT Patient is a 50 yo  male w/ hx of alcohol use disorder-severe, cocaine abuse, benzodiazapine abuse, MDD, HTN, dyslipidemia, HFrEF presents to [facility] from [facility] due to worsening depression, SI, and HI. Patient appears yesterday likely due to dreaming about his son.  Patient denies this as an nightmare and is still currently agreeable to scheduled medications at this time.  Social worker is working with patient to go to a residential treatment facility following discharge from unit.  PLAN Safety and Monitoring:             -- Involuntary admission to inpatient psychiatric unit for safety, stabilization and treatment             -- Daily contact with patient to assess and evaluate symptoms and progress in treatment             -- Patient's case to be discussed in multi-disciplinary team meeting             -- Observation Level : q15 minute checks             -- Vital signs:  q12 hours             -- Precautions: suicide, elopement, and assault   2. Psychiatric Diagnoses and Treatment:  MDD-severe w/o psychosis Substance Induced Mood Disorder -Continue Zoloft 50 mg qd  -Advised to inform MD or RN if his mood becomes elevated. -Continue group therapy attendance   Insomnia -Continue trazodone 100 mg qhs prn for sleep  3. Medical Problems being addressed Alcohol withdrawal -CIWA protocol in place, has not required prn Ativan during admission  Recurrent chest pain (Per IM Consult Note, appreciate recs) -Unlikely to be associated with coronary artery disease as patient had a negative heart cath in April 2022.  He is  also had negative EKGs and negative troponin during this hospital stay.  With his reproducible left anterior chest wall chest pain, this is likely to be musculoskeletal in nature.  Per IM recs: -D-dimer mildly elevated 0.6.  His Wells criteria score is 0 and pretest probability for PE is low with prevalence of 4%.  Also patient is not tachycardic, not hypoxic -Patient to follow-up with his outpatient cardiologist -Continue aspirin, Lipitor -Would recommend pain medication as needed, Tylenol preferred due to his history of substance abuse   Chronic systolic heart failure (Per IM Consult Note, appreciate recs) Hypertension -Previous EF 25 to 30%, continue home medications including Toprol, Entresto, spironolactone, Jardiance -Blood pressure remains stable -BNP 16.5  4. Discharge Planning:  -- Social work and case management to assist with discharge planning and identification of hospital follow-up needs prior to discharge -- Estimated LOS: 5-7 days -- Discharge Concerns: Need to establish a safety plan; Medication compliance and effectiveness -- Discharge Goals: Return home with outpatient referrals for mental health follow-up including medication management/psychotherapy   Park Pope, MD 10/03/2020, 1:55 PM

## 2020-10-03 NOTE — Progress Notes (Signed)
Pt rates his anxiety, depression and hopelessness all 5/10 with current stressors being "Not being able to see my son, placement issues after here and job to get back on my feet". Denies SI, HI, AVH and pain when assessed. Observed to be animated with bright affect. Interactive with peers and staff. Attended scheduled groups on and off unit. Tolerated meals fairly. Remains medication compliant without discomfort at this time.  All medications given with verbal education and effects monitored. Emotional support and encouragement offered. Q 15 minutes safety checks maintained without outburst or self harm gestures.  Pt safe on and off unit. Denies further concerns at this time.

## 2020-10-03 NOTE — BHH Counselor (Signed)
CSW discussed with pt aftercare treatment options. Pt stated that he would like to go to residential substance use treatment. CSW told pt his options are [facility], [facility] and [facility]. Pt declined [facility] due to the facility being in [place]. CSW explained that pt will most likely have to go home before going to the facility due to bed availability. Pt stated that if he goes home he will not go to rehab. CSW counseled pt on choices and consequences to those choices. Pt understood and asked CSW to still send referrals to [facility] and [facility].   Fredirick Lathe, LCSWA Clinicial Social Worker [employer]

## 2020-10-03 NOTE — Progress Notes (Signed)
Patient did attend the evening speaker AA meeting.  

## 2020-10-03 NOTE — Progress Notes (Signed)
   10/03/20 0001  Psych Admission Type (Psych Patients Only)  Admission Status Voluntary  Psychosocial Assessment  Patient Complaints None  Eye Contact Fair  Facial Expression Animated  Affect Appropriate to circumstance  Speech Logical/coherent  Interaction Assertive  Motor Activity Other (Comment) (WDL)  Behavior Characteristics Appropriate to situation  Mood Pleasant  Thought Process  Content WDL  Delusions None reported or observed  Perception WDL  Hallucination None reported or observed  Judgment Poor  Confusion None  Danger to Self  Current suicidal ideation? Denies  Self-Injurious Behavior No self-injurious ideation or behavior indicators observed or expressed   Agreement Not to Harm Self Yes  Description of Agreement verbal  Danger to Others  Danger to Others None reported or observed  Danger to Others Abnormal  Harmful Behavior to others No threats or harm toward other people  Destructive Behavior No threats or harm toward property

## 2020-10-03 NOTE — BHH Group Notes (Signed)
Spiritual care group on grief and loss facilitated by chaplain Dyanne Carrel, [facility]   Group Goal:   Support / Education around grief and loss   Members engage in facilitated group support and psycho-social education.   Group Description:   Following introductions and group rules, group members engaged in facilitated group dialog and support around topic of loss, with particular support around experiences of loss in their lives. Group Identified types of loss (relationships / self / things) and identified patterns, circumstances, and changes that precipitate losses. Reflected on thoughts / feelings around loss, normalized grief responses, and recognized variety in grief experience. Group noted Worden's four tasks of grief in discussion.   Group drew on Adlerian / Rogerian, narrative, MI,   Patient Progress: Patient participated in group and actively engaged in the conversation.  He talked about his experience with finding himself again by being here and laughing with his peers.  He was encouraging of his peers and showed active listening to their stories.  Chaplain Dyanne Carrel, [facility] Pager, [phone] 10:11 PM

## 2020-10-03 NOTE — Group Note (Signed)
Recreation Therapy Group Note   Group Topic:Stress Management  Group Date: 10/03/2020 Start Time: 0930 End Time: 0945 Facilitators: Caroll Rancher, LRT/CTRS Location: [address] Day[address]   Goal Area(s) Addresses:  Patient will actively participate in stress management techniques presented during session.  Patient will successfully identify benefit of practicing stress management post d/c.    Group Description: Guided Imagery. LRT provided education, instruction, and demonstration on practice of visualization via guided imagery. Patient was asked to participate in the technique introduced during session. LRT debriefed including topics of mindfulness, stress management and specific scenarios each patient could use these techniques. Patients were given suggestions of ways to access scripts post d/c and encouraged to explore Youtube and other apps available on smartphones, tablets, and computers.   Affect/Mood: Appropriate   Participation Level: Engaged   Participation Quality: Independent   Behavior: Appropriate   Speech/Thought Process: Focused   Insight: Good   Judgement: Good   Modes of Intervention: Relaxation Exercise   Patient Response to Interventions:  Engaged   Education Outcome:  Acknowledges education and In group clarification offered    Clinical Observations/Individualized Feedback: Pt attended and participated in group session.  Pt expressed no concerns and asked no questions.     Plan: Continue to engage patient in RT group sessions 2-3x/week.   Caroll Rancher, LRT/CTRS 10/03/2020 11:34 AM

## 2020-10-03 NOTE — BHH Group Notes (Signed)
Type of Therapy/Topic: Group Therapy: Feelings about Diagnosis  Participation Level: Active  Description of Group: This group will allow patients to explore their thoughts and feelings about diagnoses they have received. Patients will be guided to explore their level of understanding and acceptance of these diagnoses. Facilitator will encourage patients to process their thoughts and feelings about the reactions of others to their diagnosis and will guide patients in identifying ways to discuss their diagnosis with significant others in their lives. This group will be process-oriented, with patients participating in exploration of their own experiences, giving and receiving support, and processing challenge from other group members.  Therapeutic Goals: 1. Patient will demonstrate understanding of diagnosis as evidenced by identifying two or more symptoms of the disorder 2. Patient will be able to express two feelings regarding the diagnosis 3. Patient will demonstrate their ability to communicate their needs through discussion and/or role play  Summary of Patient Progress: The Pt reports that they often feel that they need prayer and extra help with their diagnosis.  The Pt accepted the worksheet and participated in the group discussion with their peers. The Pt showed understanding of what their diagnosis is and a willingness to explore more information about ways to improve their future around that diagnosis.

## 2020-10-04 MED ORDER — QUETIAPINE FUMARATE 50 MG PO TABS
50.0000 mg | ORAL_TABLET | Freq: Every day | ORAL | Status: DC
  Administered 2020-10-04 – 2020-10-05 (×2): 50 mg via ORAL
  Filled 2020-10-04 (×4): qty 1

## 2020-10-04 NOTE — Progress Notes (Signed)
Adult Psychoeducational Group Note  Date:  10/04/2020 Time:  8:21 PM  Group Topic/Focus:  Wrap-Up Group:   The focus of this group is to help patients review their daily goal of treatment and discuss progress on daily workbooks.  Participation Level:  Active  Participation Quality:  Appropriate  Affect:  Appropriate  Cognitive:  Appropriate  Insight: Appropriate  Engagement in Group:  Engaged  Modes of Intervention:  Discussion  Additional Comments:  patient said his day was a 4. His goal for today he had no goal for today. It was a free day for him. His coping skills friendship.   Charna Busman Long 10/04/2020, 8:21 PM

## 2020-10-04 NOTE — BHH Group Notes (Signed)
Adult Psychoeducational Group Note  Date:  10/04/2020 Time:  4:57 PM  Group Topic/Focus:  Managing Feelings:   The focus of this group is to identify what feelings patients have difficulty handling and develop a plan to handle them in a healthier way upon discharge.  Participation Level:  Active  Participation Quality:  Appropriate  Affect:  Appropriate  Cognitive:  Appropriate  Insight: Appropriate  Engagement in Group:  Engaged  Modes of Intervention:  Discussion  Additional Comments:  Patient attended and participated in the Psycho-Ed group.  Jearl Klinefelter 10/04/2020, 4:57 PM

## 2020-10-04 NOTE — BHH Counselor (Signed)
CSW spoke with Dr. Mason Jim and Hazle Quant who stated that when they spoke to the pt that he said he will have a bed at [facility] this week. CSW clarified this with the pt and doctors and stated that this information is incorrect and that the pt has been placed on the waitlist and will need to call daily to check for open beds. Dr. Hazle Quant stated that pt would like shelter resources. CSW provided pt shelter resources. Pt stated he never said he would go to a shelter that he would "go to his brother's home and be strong" and declined to take the resources. CSW notified physicians.   Fredirick Lathe, LCSWA Clinicial Social Worker [employer]

## 2020-10-04 NOTE — Group Note (Signed)
Recreation Therapy Group Note   Group Topic:Animal Assisted Therapy   Group Date: 10/04/2020 Start Time: 1430 End Time: 1510 Facilitators: Bjorn Loser, NT Location: [address]   AAA/T Program Assumption of Risk Form signed by Patient/ or Parent Legal Guardian Yes  Patient is free of allergies or severe asthma Yes  Patient reports no fear of animals No  Patient reports no history of cruelty to animals Yes  Patient understands his/her participation is voluntary Yes  Patient washes hands before animal contact Yes  Patient washes hands after animal contact Yes   Affect/Mood: N/A   Participation Level: Did not attend    Clinical Observations/Individualized Feedback: Pt did not attend activity.     Plan: Continue to engage patient in RT group sessions 2-3x/week.   Bruce Moreno, LRT/CTRS 10/04/2020 4:02 PM

## 2020-10-04 NOTE — Progress Notes (Addendum)
   10/03/20 2130  Psych Admission Type (Psych Patients Only)  Admission Status Voluntary  Psychosocial Assessment  Patient Complaints Depression  Eye Contact Fair  Facial Expression Animated  Affect Appropriate to circumstance  Speech Logical/coherent  Interaction Assertive  Motor Activity Other (Comment) (wnl)  Appearance/Hygiene Unremarkable  Behavior Characteristics Cooperative;Anxious  Mood Anxious;Pleasant  Thought Process  Coherency WDL  Content WDL  Delusions None reported or observed  Perception WDL  Hallucination None reported or observed  Judgment Poor  Confusion None  Danger to Self  Current suicidal ideation? Denies  Danger to Others  Danger to Others None reported or observed   Pt denies SI, HI, AVH and pain. Reports seeing black dots in his vision. Denies anxiety but rates depression 5/10. Attended group this evening. Pt does c/o not being given his BP meds at bedtime. "I tried to explain to the nurse today that I didn't want to take it after dinner. I always take it as my last pill at night when I'm at home. No one would listen to me." Assured pt that this information would be passed on to day shift. Told pt that he could refuse the medication at that time and would be given to him in the evening, if all else fails. Pt denies any withdrawal symptoms. Pt also talked about going to rehab after discharge. "I need a little more time. I was sober for 3 years. I didn't think I needed counseling help but after this, I know I do need help." Emotional support given.

## 2020-10-04 NOTE — Progress Notes (Signed)
Pt c/o of anxiety and irritability.  Pt denied SI/HI/AVH.  RN assessed for needs and concerns and provided support.  RN administered medications per provider orders.  Pt is interacting with fellow patients and attending groups. Pt took medications without incident and no adverse reactions were noted.  Pt remains safe with  q 15 min safety checks in place.  10/04/20 0815  Psych Admission Type (Psych Patients Only)  Admission Status Involuntary  Psychosocial Assessment  Patient Complaints Irritability  Eye Contact Fair  Facial Expression Animated;Anxious  Affect Appropriate to circumstance  Speech Logical/coherent  Interaction Assertive  Motor Activity Other (Comment) (wnl)  Appearance/Hygiene Unremarkable  Behavior Characteristics Irritable  Mood Irritable  Thought Process  Coherency WDL  Content WDL  Delusions None reported or observed  Perception WDL  Hallucination None reported or observed  Judgment Poor  Confusion None  Danger to Self  Current suicidal ideation? Denies  Danger to Others  Danger to Others None reported or observed

## 2020-10-04 NOTE — Plan of Care (Signed)
Discontinued spironolactone order as patient has refused it since admission. Patient's blood pressure has been stable with most recent being 126/90.  Park Pope, MD PGY1 Psychiatry Resident

## 2020-10-04 NOTE — BHH Group Notes (Signed)
Pt attended nursing group and participated in discussion. 

## 2020-10-04 NOTE — BHH Counselor (Addendum)
CSW gave pt the number for [facility] and encouraged him to complete his pre-screen.   Pt has been placed on the waitlist and has been told to call daily.  Fredirick Lathe, LCSWA Clinicial Social Worker [employer]

## 2020-10-04 NOTE — Progress Notes (Addendum)
[facility] MD Progress Note  10/04/2020 5:40 PM Bruce Moreno  MRN:  [id number] Subjective:    Reason for admission: Patient is a 50 yo male w/ hx of alcohol use disorder-severe, cocaine abuse, benzodiazapine abuse, MDD, HTN, dyslipidemia, HFrEF presents to [facility] from [facility] due to worsening depression, SI, and HI.   Chart Review, 24 hr Events: The patient's chart was reviewed and nursing notes were reviewed. The patient's case was discussed in multidisciplinary team meeting.  Per MAR: - Patient is compliant with scheduled meds. - PRNs: Trazodone, hydroxyzine, tylenol Per RN notes, no documented behavioral issues and is attending group. Patient slept, 5.5 hours  Information Obtained Today During Patient Interview (10/04/2020): Patient was seen and evaluated with Dr. Mason Jim today.  Patient states he felt overwhelmed today and not doing well.  Patient states that he had a vivid dream where he saw a deceased friend and was murdering his family.  Patient states that this was very scary to him and he could not go back to sleep after this.  Patient states that this has effected his day and he cannot focus very well today.  Patient requested to be discontinued from trazodone as he feels this is a likely culprit to his vivid dreams.  Patient requesting that he is on Seroquel 50 mg nightly.  Risk benefits and side effects discussed with patient and patient is still agreeable to taking this for insomnia.  Patient denies any physical complaints today.  Patient states that he is eating "okay".  Patient sleeping poorly due to dream and has low energy today. He denies current cravings or withdrawal symptoms from substances.  Regarding discharge planning, patient states that he is working on trying to get into [facility].  Counseled patient that it is unlikely to be a bed to bed transfer and that he needs a backup to set up in case he is discharged from the behavioral health hospital without a bed at [facility].  Patient agreeable  to finding shelter should he be discharged earlier than when bed available at [facility].  Patient calling every day to [facility] to check for bed placement.  Patient denies SI/HI/AVH, paranoia, delusions, first rank symptoms.   Principal Problem: MDD (major depressive disorder), recurrent episode, severe (HCC) Diagnosis: Principal Problem:   MDD (major depressive disorder), recurrent episode, severe (HCC) Active Problems:   Alcohol use disorder, severe, dependence (HCC)   Alcohol-induced mood disorder with depressive symptoms (HCC)   Cocaine abuse (HCC)   Chronic systolic CHF (congestive heart failure) (HCC)   HTN (hypertension)   Chest pain   Substance induced mood disorder (HCC)   Marijuana use   Suicidal ideation   Homicidal ideation  Total Time spent with patient:  I personally spent 35 minutes on the unit in direct patient care. The direct patient care time included face-to-face time with the patient, reviewing the patient's chart, communicating with other professionals, and coordinating care. Greater than 50% of this time was spent in counseling or coordinating care with the patient regarding goals of hospitalization, psycho-education, and discharge planning needs.   Past Psychiatric History: Alcohol Use Disorder - Severe s/p AA meeting and detox. 3 year sobriety but relapse in February 2022 after son passed. No psychiatric medications, no individual therapy  Past Medical History:  Past Medical History:  Diagnosis Date   Chronic systolic CHF (congestive heart failure) (HCC)    Echo at [facility] 05/16/20: EF 25-30%, grade I diastolic dysfunction, no pulmonary hypertension.   Hypercholesteremia    Hypertension  Past Surgical History:  Procedure Laterality Date   CARDIAC CATHETERIZATION  05/18/2020   No significant coronary artery disease. Nomral right and left sided filling pressures. Low-normal cardiac output.   Family History:  Family History  Problem Relation Age of Onset    Hypertension Mother    Cancer Father    Heart attack Neg Hx    Family Psychiatric  History: Patient reports none Social History:  Social History   Substance and Sexual Activity  Alcohol Use Yes   Alcohol/week: 4.0 standard drinks   Types: 4 Standard drinks or equivalent per week   Comment: Quit from 2019-2022 but relapsed 09/2020.     Social History   Substance and Sexual Activity  Drug Use Yes   Types: Cocaine    Social History   Socioeconomic History   Marital status: Single    Spouse name: Not on file   Number of children: Not on file   Years of education: Not on file   Highest education level: Not on file  Occupational History   Not on file  Tobacco Use   Smoking status: Former    Packs/day: 0.10    Years: 0.50    Pack years: 0.05    Types: Cigarettes    Start date: 03/11/2020    Quit date: 07/22/2020    Years since quitting: 0.2   Smokeless tobacco: Never  Vaping Use   Vaping Use: Never used  Substance and Sexual Activity   Alcohol use: Yes    Alcohol/week: 4.0 standard drinks    Types: 4 Standard drinks or equivalent per week    Comment: Quit from 2019-2022 but relapsed 09/2020.   Drug use: Yes    Types: Cocaine   Sexual activity: Not Currently  Other Topics Concern   Not on file  Social History Narrative   Not on file   Social Determinants of Health   Financial Resource Strain: Not on file  Food Insecurity: Not on file  Transportation Needs: Not on file  Physical Activity: Not on file  Stress: Not on file  Social Connections: Not on file   Additional Social History:     Sleep: Poor  Appetite:  Good  Current Medications: Current Facility-Administered Medications  Medication Dose Route Frequency Provider Last Rate Last Admin   acetaminophen (TYLENOL) tablet 650 mg  650 mg Oral Q6H PRN Rankin, Shuvon B, NP   650 mg at 10/03/20 1409   alum & mag hydroxide-simeth (MAALOX/MYLANTA) 200-200-20 MG/5ML suspension 30 mL  30 mL Oral Q4H PRN Rankin,  Shuvon B, NP       atorvastatin (LIPITOR) tablet 40 mg  40 mg Oral Daily Rankin, Shuvon B, NP   40 mg at 10/04/20 0817   empagliflozin (JARDIANCE) tablet 10 mg  10 mg Oral Daily Rankin, Shuvon B, NP   10 mg at 10/04/20 0816   folic acid (FOLVITE) tablet 1 mg  1 mg Oral Daily Cinderella, Margaret A   1 mg at 10/04/20 0816   hydrOXYzine (ATARAX/VISTARIL) tablet 25 mg  25 mg Oral TID PRN Jaclyn Shaggy, PA-C   25 mg at 10/03/20 2128   magnesium hydroxide (MILK OF MAGNESIA) suspension 30 mL  30 mL Oral Daily PRN Rankin, Shuvon B, NP       metoprolol succinate (TOPROL-XL) 24 hr tablet 100 mg  100 mg Oral BID Rankin, Shuvon B, NP   100 mg at 10/04/20 0816   multivitamin with minerals tablet 1 tablet  1 tablet Oral Daily Cinderella,  Margaret A   1 tablet at 10/04/20 0816   pneumococcal 23 valent vaccine (PNEUMOVAX-23) injection 0.5 mL  0.5 mL Intramuscular Tomorrow-1000 Mason Jim, Ivar Domangue E, MD       QUEtiapine (SEROQUEL) tablet 50 mg  50 mg Oral QHS Park Pope, MD       sacubitril-valsartan [facility]) 97-103 mg per tablet  1 tablet Oral BID Rankin, Shuvon B, NP   1 tablet at 10/04/20 0816   sertraline (ZOLOFT) tablet 50 mg  50 mg Oral Daily Park Pope, MD   50 mg at 10/04/20 9563   thiamine tablet 100 mg  100 mg Oral Daily Cinderella, Margaret A   100 mg at 10/04/20 8756   Or   thiamine (B-1) injection 100 mg  100 mg Intravenous Daily Cinderella, Margaret A        Lab Results:  No results found for this or any previous visit (from the past 48 hour(s)).   Blood Alcohol level:  Lab Results  Component Value Date   ETH 230 (H) 09/25/2020   ETH <10 09/12/2020    Metabolic Disorder Labs: No results found for: HGBA1C, MPG No results found for: PROLACTIN No results found for: CHOL, TRIG, HDL, CHOLHDL, VLDL, LDLCALC  Physical Findings:  Musculoskeletal: Strength & Muscle Tone: within normal limits Gait & Station: normal Patient leans: N/A  Psychiatric Specialty Exam:  Presentation  General  Appearance: Appropriate for Environment; Casual  Eye Contact:Minimal  Speech:Clear and Coherent; Slow  Speech Volume:Normal  Handedness:Right   Mood and Affect  Mood:Depressed, anxious  Affect:constricted   Thought Process  Thought Processes:Coherent; Linear  Descriptions of Associations:Intact  Orientation:Full (Time, Place and Person)  Thought Content:Logical - no evidence of psychosis, delusions, or paranoia on exam  History of Schizophrenia/Schizoaffective disorder:No  Duration of Psychotic Symptoms:No data recorded Hallucinations:Hallucinations: None  Ideas of Reference:None  Suicidal Thoughts:Suicidal Thoughts: No  Homicidal Thoughts:Homicidal Thoughts: No   Sensorium  Memory:Immediate Good; Recent Good; Remote Good  Judgment:Fair  Insight:Fair   Executive Functions  Concentration:Good  Attention Span:Good  Recall:Good  Fund of Knowledge:Good  Language:Good   Psychomotor Activity  Psychomotor Activity:Normal   Assets  Assets:Communication Skills; Desire for Improvement   Sleep  Sleep:Sleep: Good Number of Hours of Sleep: 6.5    Physical Exam: Physical Exam Vitals and nursing note reviewed.  Constitutional:      Appearance: Normal appearance. He is normal weight.  HENT:     Head: Normocephalic and atraumatic.  Pulmonary:     Effort: Pulmonary effort is normal.  Neurological:     General: No focal deficit present.     Mental Status: He is oriented to person, place, and time.   Review of Systems  Respiratory:  Negative for shortness of breath.   Cardiovascular:  Negative for chest pain.  Gastrointestinal:  Negative for abdominal pain, constipation, diarrhea, heartburn, nausea and vomiting.  Neurological:  Negative for headaches.  Blood pressure 126/90, pulse 90, temperature 98 F (36.7 C), temperature source Oral, resp. rate 18, height 5\' 6"  (1.676 m), SpO2 100 %. Body mass index is 30.96 kg/m.   Treatment Plan  Summary: Daily contact with patient to assess and evaluate symptoms and progress in treatment and Medication management  ASSESSMENT Patient is a 50 yo male w/ hx of alcohol use disorder-severe, cocaine abuse, benzodiazapine abuse, MDD, HTN, dyslipidemia, chronic systolic heart failure, presents to [facility] from [facility] due to worsening depression, SI, and HI.  Social worker is working with patient to go to a residential treatment facility  following discharge from unit but is unlikely to discharge bed to bed as there has been limited availability in residential treatment facilities.  Due to the patient's vivid dream, trazodone will be discontinued and Seroquel will be started in lieu of trazodone.  PLAN Safety and Monitoring:             -- Involuntary admission to inpatient psychiatric unit for safety, stabilization and treatment             -- Daily contact with patient to assess and evaluate symptoms and progress in treatment             -- Patient's case to be discussed in multi-disciplinary team meeting             -- Observation Level : q15 minute checks             -- Vital signs:  q12 hours             -- Precautions: suicide, elopement, and assault   2. Psychiatric Diagnoses and Treatment:  MDD-recurrent severe w/o psychosis R/o Substance Induced Mood Disorder -Continue Zoloft 50 mg qd  -Advised to inform MD or RN if his mood becomes elevated. -Continue group therapy attendance   Insomnia -Discontinuing trazodone due to vivid dreams -Starting Seroquel 50 mg nightly for insomnia.  R/B/SE have been explained to patient and patient is agreeable to medication trial.  3. 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  He is also had negative EKGs and negative troponin during this hospital stay.  With his reproducible left anterior chest wall chest pain, this is likely to be musculoskeletal in nature.  Per IM recs: -D-dimer mildly elevated 0.6.  His Wells criteria score is 0 and pretest probability for PE is low with prevalence of 4%.  Also patient is not tachycardic, not hypoxic -Patient to follow-up with his outpatient cardiologist -Continue aspirin, Lipitor -Would recommend pain medication as needed, Tylenol preferred due to his history of substance abuse   Chronic systolic heart failure (Per IM Consult Note, appreciate recs) Hypertension Hyperlipidemia -Previous EF 25 to 30%, continue home medications including Toprol, Entresto, (refusing spironolactone)  - Continue Jardiance 10mg  daily -Blood pressure remains stable -BNP 16.5 - Continue Lipitor 40mg  daily  4. Discharge Planning:  -- Social work and case management to assist with discharge planning and identification of hospital follow-up needs prior to discharge -- Estimated LOS: 2-3 days -- Discharge Concerns: Need to establish a safety plan; Medication compliance and effectiveness -- Discharge Goals: Return home with outpatient referrals for mental health follow-up including medication management/psychotherapy   , MD 10/04/2020, 5:40 PM

## 2020-10-05 ENCOUNTER — Encounter (HOSPITAL_COMMUNITY): Payer: Self-pay

## 2020-10-05 LAB — LIPID PANEL
Cholesterol: 141 mg/dL (ref 0–200)
HDL: 36 mg/dL — ABNORMAL LOW (ref >40)
LDL Cholesterol: 85 mg/dL (ref 0–99)
Total CHOL/HDL Ratio: 3.9 RATIO
Triglycerides: 99 mg/dL (ref <150)
VLDL: 20 mg/dL (ref 0–40)

## 2020-10-05 LAB — HEMOGLOBIN A1C
Hgb A1c MFr Bld: 5.7 % — ABNORMAL HIGH (ref 4.8–5.6)
Mean Plasma Glucose: 116.89 mg/dL

## 2020-10-05 MED ORDER — METOPROLOL SUCCINATE ER 100 MG PO TB24
100.0000 mg | ORAL_TABLET | Freq: Two times a day (BID) | ORAL | Status: DC
  Administered 2020-10-05 – 2020-10-06 (×3): 100 mg via ORAL
  Filled 2020-10-05 (×7): qty 1

## 2020-10-05 MED ORDER — SACUBITRIL-VALSARTAN 97-103 MG PO TABS
1.0000 | ORAL_TABLET | Freq: Two times a day (BID) | ORAL | Status: DC
  Administered 2020-10-05 – 2020-10-06 (×2): 1 via ORAL
  Filled 2020-10-05 (×6): qty 1

## 2020-10-05 NOTE — Progress Notes (Addendum)
   10/04/20 2000  Psych Admission Type (Psych Patients Only)  Admission Status Voluntary  Psychosocial Assessment  Patient Complaints Anxiety;Irritability  Eye Contact Fair  Facial Expression Animated;Anxious  Speech Logical/coherent  Interaction Assertive  Motor Activity Other (Comment) (wnl)  Appearance/Hygiene Unremarkable  Behavior Characteristics Cooperative;Anxious  Mood Anxious;Irritable;Pleasant  Thought Process  Coherency WDL  Content Blaming others  Delusions None reported or observed  Perception WDL  Hallucination None reported or observed  Judgment Poor  Confusion None  Danger to Self  Current suicidal ideation? Denies  Danger to Others  Danger to Others None reported or observed   Pt seen at nurse's station. Pt denies SI, HI, AVH and pain. Pt rates anxiety 6/10 and denies depression. Says he was irritable earlier in the day because he was trying to convey information to the SW about his conversation with person at [facility] but there was a miscommunication. "She wasn't listening to me. She said she sent my paperwork but the lady on the phone was telling me something else. Later on, she came back and tried to apologize saying maybe she didn't send my stuff in. I was over it by then." Pt also irritable because he had to take his Metoprolol after dinner instead of at bedtime. "I'm taking my Entresto tonight but they made me take my Metoprolol early. I got tired of arguing. I know my medicines. I take them at nighttime."

## 2020-10-05 NOTE — Plan of Care (Signed)
Nurse discussed anxiety, depression and coping skills with patient.  

## 2020-10-05 NOTE — BHH Group Notes (Signed)
BHH Group Notes:  (Nursing/MHT/Case Management/Adjunct)  Date:  10/05/2020  Time:  9:21 AM  Type of Therapy:  Group Therapy  Participation Level:  Active  Participation Quality:  Appropriate  Affect:  Appropriate  Cognitive:  Appropriate  Insight:  Appropriate  Engagement in Group:  Engaged  Modes of Intervention:  Discussion  Summary of Progress/Problems:Pt was engaged in group.  Jaquita Rector 10/05/2020, 9:21 AM

## 2020-10-05 NOTE — Progress Notes (Signed)
   10/05/20 1500  Psych Admission Type (Psych Patients Only)  Admission Status Voluntary  Psychosocial Assessment  Patient Complaints Anxiety;Depression;Hopelessness  Eye Contact Fair  Facial Expression Animated;Anxious  Affect Appropriate to circumstance  Speech Logical/coherent  Interaction Assertive  Motor Activity Other (Comment) (wnl)  Appearance/Hygiene Unremarkable  Behavior Characteristics Anxious  Mood Depressed;Anxious  Thought Process  Coherency WDL  Content Blaming others  Delusions None reported or observed  Perception WDL  Hallucination None reported or observed  Judgment Poor  Confusion None  Danger to Self  Current suicidal ideation? Denies  Danger to Others  Danger to Others None reported or observed   Patient alert and oriented x 4.  Affect is pleasant and slightly anxious.  Denies AVH, SI or HI.   Patient reports the following on Self Inventory Questionnaire:  Sleeping poor with medication, appetite good, energy level fair, rates depression 3/10 and anxiety 3/10.  Patient compliant with meds. Patient denies pain.  Reviewed POC with patient.  Questions answered and understanding verbalized. Ongoing Q15 minute safety check rounds per unit protocol.

## 2020-10-05 NOTE — Group Note (Signed)
LCSW Group Therapy Note   Group Date: 10/05/2020 Start Time: 1300 End Time: 1400   Type of Therapy and Topic: Group Therapy: Effective Communication   Participation Level: Active    Description of Group:  In this group patients will be asked to identify their own styles of communication as well as defining and identifying passive, assertive, and aggressive styles of communication. Participants will identify strategies to communicate in a more assertive manner in an effort to appropriately meet their needs. This group will be process-oriented, with patients participating in exploration of their own experiences as well as giving and receiving support and challenge from other group members.   Therapeutic Goals: 1. Patient will identify their personal communication style. 2. Patient will identify passive, assertive, and aggressive forms of communication. 3. Patient will identify strategies for developing more effective communication to appropriately meet their needs.    Summary of Patient Progress:  Bruce Moreno states that sometimes he is good at communication and sometimes he is not but it depends on the situation.  The Pt accepted the worksheet and participated in open discussion/communication with their peers.     Therapeutic Modalities:  Communication Skills Solution Focused Therapy Motivational Interviewing  Aram Beecham, [place] 10/05/2020  2:21 PM

## 2020-10-05 NOTE — Progress Notes (Signed)
Adult Psychoeducational Group Note  Date:  10/05/2020 Time:  4:51 PM  Group Topic/Focus:  Goals Group:   The focus of this group is to help patients establish daily goals to achieve during treatment and discuss how the patient can incorporate goal setting into their daily lives to aide in recovery.  Participation Level:  Active  Participation Quality:  Appropriate and Attentive  Affect:  Appropriate  Cognitive:  Appropriate  Insight: Appropriate  Engagement in Group:  Engaged  Modes of Intervention:  Activity  Additional Comments:   Pt attended the personal development group and remained appropriate and engaged throughout the duration of the group.   Sheran Lawless 10/05/2020, 4:51 PM

## 2020-10-05 NOTE — Group Note (Signed)
Recreation Therapy Group Note   Group Topic:Stress Management  Group Date: 10/05/2020 Start Time: 0930 End Time: 0950 Facilitators: Caroll Rancher, LRT/CTRS Location: [address] Dayroom  Goal Area(s) Addresses:  Patient will actively participate in stress management techniques presented during session.  Patient will successfully identify benefit of practicing stress management post d/c.   Group Description:  Guided Imagery. LRT provided education, instruction, and demonstration on practice of visualization via guided imagery. Patient was asked to participate in the technique introduced during session in which patients would take a journey along the beach. LRT debriefed including topics of mindfulness, stress management and specific scenarios each patient could use these techniques. Patients were given suggestions of ways to access scripts post d/c and encouraged to explore Youtube and other apps available on smartphones, tablets, and computers.   Affect/Mood: Appropriate   Participation Level: Engaged   Participation Quality: Independent   Behavior: Appropriate   Speech/Thought Process: Focused   Insight: Good   Judgement: Good   Modes of Intervention: Guided Imagery   Patient Response to Interventions:  Attentive and Engaged   Education Outcome:  Acknowledges education and In group clarification offered     Clinical Observations/Individualized Feedback: Pt engaged in activity.  Pt expressed no concerns or had no questions at completion of group session.    Plan: Continue to engage patient in RT group sessions 2-3x/week.   Caroll Rancher, LRT/CTRS 10/05/2020 11:15 AM

## 2020-10-05 NOTE — Progress Notes (Signed)
[facility] MD Progress Note  10/05/2020 10:32 AM Bruce Moreno  MRN:  [id number] Subjective:    Reason for admission: Patient is a 50 yo male w/ hx of alcohol use disorder-severe, cocaine abuse, benzodiazapine abuse, MDD, HTN, dyslipidemia, HFrEF presents to [facility] from [facility] due to worsening depression, SI, and HI.   Chart Review, 24 hr Events: The patient's chart was reviewed and nursing notes were reviewed. The patient's case was discussed in multidisciplinary team meeting.  Per MAR: - Patient is compliant with scheduled meds. - PRNs: Trazodone, hydroxyzine, tylenol Per RN notes, no documented behavioral issues and is attending group. Patient slept, 6.25 hours  Information Obtained Today During Patient Interview (10/05/2020): Patient was seen and staffed with Dr. Lucianne Muss today.  Patient states he felt better today.  Patient states that he had been able to get good sleep without vivid dreams but woke up and could not go back to sleep around 4 AM.  Patient states that he normally gets more rest but is otherwise feeling better.  Patient endorses mild depression and some anxiety related to his discharge planning.  Patient states that he talked with [facility] and is considering returning to [facility] for residential treatment.  Patient states that his brother states that his "not strong enough not be tempted to drink or use substances".  Patient states that he is anxious about discharge but does feel that he is starting to be more ready for discharge at this time.  Patient has no complaints at this time.  Patient eating and sleeping more appropriately now and fluids.  Patient has no diarrhea, cough, headache, chest pain, nausea, vomiting.  Patient denies SI/HI/AVH, paranoia, delusions, first rank symptoms.   Principal Problem: MDD (major depressive disorder), recurrent episode, severe (HCC) Diagnosis: Principal Problem:   MDD (major depressive disorder), recurrent episode, severe (HCC) Active Problems:    Alcohol use disorder, severe, dependence (HCC)   Alcohol-induced mood disorder with depressive symptoms (HCC)   Cocaine abuse (HCC)   Chronic systolic CHF (congestive heart failure) (HCC)   HTN (hypertension)   Chest pain   Substance induced mood disorder (HCC)   Marijuana use   Suicidal ideation   Homicidal ideation  Total Time spent with patient:  I personally spent 35 minutes on the unit in direct patient care. The direct patient care time included face-to-face time with the patient, reviewing the patient's chart, communicating with other professionals, and coordinating care. Greater than 50% of this time was spent in counseling or coordinating care with the patient regarding goals of hospitalization, psycho-education, and discharge planning needs.   Past Psychiatric History: Alcohol Use Disorder - Severe s/p AA meeting and detox. 3 year sobriety but relapse in February 2022 after son passed. No psychiatric medications, no individual therapy  Past Medical History:  Past Medical History:  Diagnosis Date   Chronic systolic CHF (congestive heart failure) (HCC)    Echo at [facility] 05/16/20: EF 25-30%, grade I diastolic dysfunction, no pulmonary hypertension.   Hypercholesteremia    Hypertension     Past Surgical History:  Procedure Laterality Date   CARDIAC CATHETERIZATION  05/18/2020   No significant coronary artery disease. Nomral right and left sided filling pressures. Low-normal cardiac output.   Family History:  Family History  Problem Relation Age of Onset   Hypertension Mother    Cancer Father    Heart attack Neg Hx    Family Psychiatric  History: Patient reports none Social History:  Social History   Substance and Sexual Activity  Alcohol Use Yes   Alcohol/week: 4.0 standard drinks   Types: 4 Standard drinks or equivalent per week   Comment: Quit from 2019-2022 but relapsed 09/2020.     Social History   Substance and Sexual Activity  Drug Use Yes   Types: Cocaine     Social History   Socioeconomic History   Marital status: Single    Spouse name: Not on file   Number of children: Not on file   Years of education: Not on file   Highest education level: Not on file  Occupational History   Not on file  Tobacco Use   Smoking status: Former    Packs/day: 0.10    Years: 0.50    Pack years: 0.05    Types: Cigarettes    Start date: 03/11/2020    Quit date: 07/22/2020    Years since quitting: 0.2   Smokeless tobacco: Never  Vaping Use   Vaping Use: Never used  Substance and Sexual Activity   Alcohol use: Yes    Alcohol/week: 4.0 standard drinks    Types: 4 Standard drinks or equivalent per week    Comment: Quit from 2019-2022 but relapsed 09/2020.   Drug use: Yes    Types: Cocaine   Sexual activity: Not Currently  Other Topics Concern   Not on file  Social History Narrative   Not on file   Social Determinants of Health   Financial Resource Strain: Not on file  Food Insecurity: Not on file  Transportation Needs: Not on file  Physical Activity: Not on file  Stress: Not on file  Social Connections: Not on file   Additional Social History:     Sleep: Poor  Appetite:  Good  Current Medications: Current Facility-Administered Medications  Medication Dose Route Frequency Provider Last Rate Last Admin   acetaminophen (TYLENOL) tablet 650 mg  650 mg Oral Q6H PRN Rankin, Shuvon B, NP   650 mg at 10/03/20 1409   alum & mag hydroxide-simeth (MAALOX/MYLANTA) 200-200-20 MG/5ML suspension 30 mL  30 mL Oral Q4H PRN Rankin, Shuvon B, NP       atorvastatin (LIPITOR) tablet 40 mg  40 mg Oral Daily Rankin, Shuvon B, NP   40 mg at 10/05/20 0812   empagliflozin (JARDIANCE) tablet 10 mg  10 mg Oral Daily Rankin, Shuvon B, NP   10 mg at 10/05/20 0626   folic acid (FOLVITE) tablet 1 mg  1 mg Oral Daily Cinderella, Margaret A   1 mg at 10/05/20 0813   hydrOXYzine (ATARAX/VISTARIL) tablet 25 mg  25 mg Oral TID PRN Jaclyn Shaggy, PA-C   25 mg at 10/05/20  0815   magnesium hydroxide (MILK OF MAGNESIA) suspension 30 mL  30 mL Oral Daily PRN Rankin, Shuvon B, NP       metoprolol succinate (TOPROL-XL) 24 hr tablet 100 mg  100 mg Oral BID Park Pope, MD   100 mg at 10/05/20 0813   multivitamin with minerals tablet 1 tablet  1 tablet Oral Daily Cinderella, Margaret A   1 tablet at 10/05/20 9485   pneumococcal 23 valent vaccine (PNEUMOVAX-23) injection 0.5 mL  0.5 mL Intramuscular Tomorrow-1000 Mason Jim, Amy E, MD       QUEtiapine (SEROQUEL) tablet 50 mg  50 mg Oral QHS Park Pope, MD   50 mg at 10/04/20 2135   sacubitril-valsartan (ENTRESTO) 97-103 mg per tablet  1 tablet Oral BID Park Pope, MD       sertraline (ZOLOFT) tablet 50 mg  50 mg Oral Daily Park Pope, MD   50 mg at 10/05/20 6144   thiamine tablet 100 mg  100 mg Oral Daily Cinderella, Margaret A   100 mg at 10/05/20 3154   Or   thiamine (B-1) injection 100 mg  100 mg Intravenous Daily Cinderella, Margaret A        Lab Results:  Results for orders placed or performed during the hospital encounter of 09/28/20 (from the past 48 hour(s))  Lipid panel     Status: Abnormal   Collection Time: 10/05/20  6:11 AM  Result Value Ref Range   Cholesterol 141 0 - 200 mg/dL   Triglycerides 99 <008 mg/dL   HDL 36 (L) >67 mg/dL   Total CHOL/HDL Ratio 3.9 RATIO   VLDL 20 0 - 40 mg/dL   LDL Cholesterol 85 0 - 99 mg/dL    Comment:        Total Cholesterol/HDL:CHD Risk Coronary Heart Disease Risk Table                     Men   Women  1/2 Average Risk   3.4   3.3  Average Risk       5.0   4.4  2 X Average Risk   9.6   7.1  3 X Average Risk  23.4   11.0        Use the calculated Patient Ratio above and the CHD Risk Table to determine the patient's CHD Risk.        ATP III CLASSIFICATION (LDL):  <100     mg/dL   Optimal  619-509  mg/dL   Near or Above                    Optimal  130-159  mg/dL   Borderline  326-712  mg/dL   High  >458     mg/dL   Very High Performed at [facility], [address] [place], [place] [address]   Hemoglobin A1c     Status: Abnormal   Collection Time: 10/05/20  6:11 AM  Result Value Ref Range   Hgb A1c MFr Bld 5.7 (H) 4.8 - 5.6 %    Comment: (NOTE) Pre diabetes:          5.7%-6.4%  Diabetes:              >6.4%  Glycemic control for   <7.0% adults with diabetes    Mean Plasma Glucose 116.89 mg/dL    Comment: Performed at [facility], [address], [place], [place] [address]     Blood Alcohol level:  Lab Results  Component Value Date   ETH 230 (H) 09/25/2020   ETH <10 09/12/2020    Metabolic Disorder Labs: Lab Results  Component Value Date   HGBA1C 5.7 (H) 10/05/2020   MPG 116.89 10/05/2020   No results found for: PROLACTIN Lab Results  Component Value Date   CHOL 141 10/05/2020   TRIG 99 10/05/2020   HDL 36 (L) 10/05/2020   CHOLHDL 3.9 10/05/2020   VLDL 20 10/05/2020   LDLCALC 85 10/05/2020    Physical Findings:  Musculoskeletal: Strength & Muscle Tone: within normal limits Gait & Station: normal Patient leans: N/A  Psychiatric Specialty Exam:  Presentation  General Appearance: Appropriate for Environment; Casual  Eye Contact:Good  Speech:Clear and Coherent  Speech Volume:Normal  Handedness:Right   Mood and Affect  Mood:Depressed , anxious  Affect:constricted  Thought Process  Thought Processes:Coherent; Linear  Descriptions of Associations:Intact  Orientation:Full (Time, Place and Person)  Thought Content:Logical  - no evidence of psychosis, delusions, or paranoia on exam  History of Schizophrenia/Schizoaffective disorder:No  Duration of Psychotic Symptoms:No data recorded Hallucinations:Hallucinations: None  Ideas of Reference:None  Suicidal Thoughts:Suicidal Thoughts: No  Homicidal Thoughts:Homicidal Thoughts: No   Sensorium  Memory:Immediate Good; Recent Good; Remote Good  Judgment:Fair  Insight:Fair   Executive Functions   Concentration:Fair  Attention Span:Good  Recall:Good  Fund of Knowledge:Good  Language:Good   Psychomotor Activity  Psychomotor Activity:Normal   Assets  Assets:Communication Skills; Desire for Improvement   Sleep  Sleep:Sleep: Good Number of Hours of Sleep: 6.25    Physical Exam: Physical Exam Vitals and nursing note reviewed.  Constitutional:      Appearance: Normal appearance. He is normal weight.  HENT:     Head: Normocephalic and atraumatic.  Pulmonary:     Effort: Pulmonary effort is normal.  Neurological:     General: No focal deficit present.     Mental Status: He is oriented to person, place, and time.   Review of Systems  Respiratory:  Negative for shortness of breath.   Cardiovascular:  Negative for chest pain.  Gastrointestinal:  Negative for abdominal pain, constipation, diarrhea, heartburn, nausea and vomiting.  Neurological:  Negative for headaches.  Blood pressure 118/87, pulse 76, temperature 98.4 F (36.9 C), temperature source Oral, resp. rate 18, height  (1.676 m), SpO2 100 %. Body mass index is 30.96 kg/m.   Treatment Plan Summary: Daily contact with patient to assess and evaluate symptoms and progress in treatment and Medication management  ASSESSMENT Patient is a 50 yo male w/ hx of alcohol use disorder-severe, cocaine abuse, benzodiazapine abuse, MDD, HTN, dyslipidemia, chronic systolic heart failure, presents to [facility] from [facility] due to worsening depression, SI, and HI.  Patient has felt better since being switched from trazodone which to Seroquel.  Will reassess tomorrow.  Patient states he will work on progress with discharge planning we will go for residential treatment.  [facility]. PLAN Safety and Monitoring:             -- Involuntary admission to inpatient psychiatric unit for safety, stabilization and treatment             -- Daily contact with patient to assess and evaluate  symptoms and progress in treatment             -- Patient's case to be discussed in multi-disciplinary team meeting             -- Observation Level : q15 minute checks             -- Vital signs:  q12 hours             -- Precautions: suicide, elopement, and assault   2. Psychiatric Diagnoses and Treatment:  MDD-recurrent severe w/o psychosis R/o Substance Induced Mood Disorder -Continue Zoloft 50 mg qd  -Advised to inform MD or RN if his mood becomes elevated. -Continue group therapy attendance   Insomnia -Discontinuing trazodone due to vivid dreams -Continue Seroquel 50 mg nightly for insomnia  3. 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  He is also had negative EKGs and negative troponin during this hospital stay.  With his reproducible left anterior chest wall chest pain, this is likely to be musculoskeletal in nature.  Per IM recs: -D-dimer mildly elevated 0.6.  His Wells criteria score is 0 and pretest probability for PE is low with prevalence of 4%.  Also patient is not tachycardic, not hypoxic -Patient to follow-up with his outpatient cardiologist -Continue aspirin, Lipitor -Would recommend pain medication as needed, Tylenol preferred due to his history of substance abuse   Chronic systolic heart failure (Per IM Consult Note, appreciate recs) Hypertension Hyperlipidemia -Previous EF 25 to 30%, continue home medications including Toprol, Entresto, (refusing spironolactone)  - Continue Jardiance 10mg  daily -Blood pressure remains stable -BNP 16.5 - Continue Lipitor  40mg  daily  4. Discharge Planning:  -- Social work and case management to assist with discharge planning and identification of hospital follow-up needs prior to discharge -- Estimated LOS: 2-3 days -- Discharge Concerns: Need to establish a safety plan; Medication compliance and effectiveness -- Discharge Goals: Return home with outpatient referrals for mental health follow-up including medication management/psychotherapy   , MD 10/05/2020, 10:32 AM

## 2020-10-05 NOTE — Group Note (Deleted)
LCSW Group Therapy Note   Group Date: 10/05/2020 Start Time: 1300 End Time: 1330   Type of Therapy and Topic:  Group Therapy:   Participation Level:  {B[facility] PARTICIPATION LEVEL:22264}  Description of Group:   Therapeutic Goals:  1.     Summary of Patient Progress:    ***  Therapeutic Modalities:   Santiago Graf M Daryl Quiros, LCSWA 10/05/2020  2:01 PM    

## 2020-10-05 NOTE — BH IP Treatment Plan (Signed)
Interdisciplinary Treatment and Diagnostic Plan Update  10/05/2020 Time of Session:  Bruce Moreno MRN: [id number]  Principal Diagnosis: MDD (major depressive disorder), recurrent episode, severe (HCC)  Secondary Diagnoses: Principal Problem:   MDD (major depressive disorder), recurrent episode, severe (HCC) Active Problems:   Alcohol use disorder, severe, dependence (HCC)   Alcohol-induced mood disorder with depressive symptoms (HCC)   Cocaine abuse (HCC)   Chronic systolic CHF (congestive heart failure) (HCC)   HTN (hypertension)   Chest pain   Substance induced mood disorder (HCC)   Marijuana use   Suicidal ideation   Homicidal ideation   Current Medications:  Current Facility-Administered Medications  Medication Dose Route Frequency Provider Last Rate Last Admin   acetaminophen (TYLENOL) tablet 650 mg  650 mg Oral Q6H PRN Rankin, Shuvon B, NP   650 mg at 10/03/20 1409   alum & mag hydroxide-simeth (MAALOX/MYLANTA) 200-200-20 MG/5ML suspension 30 mL  30 mL Oral Q4H PRN Rankin, Shuvon B, NP       atorvastatin (LIPITOR) tablet 40 mg  40 mg Oral Daily Rankin, Shuvon B, NP   40 mg at 10/05/20 0812   empagliflozin (JARDIANCE) tablet 10 mg  10 mg Oral Daily Rankin, Shuvon B, NP   10 mg at 10/05/20 6010   folic acid (FOLVITE) tablet 1 mg  1 mg Oral Daily Cinderella, Margaret A   1 mg at 10/05/20 0813   hydrOXYzine (ATARAX/VISTARIL) tablet 25 mg  25 mg Oral TID PRN Jaclyn Shaggy, PA-C   25 mg at 10/05/20 0815   magnesium hydroxide (MILK OF MAGNESIA) suspension 30 mL  30 mL Oral Daily PRN Rankin, Shuvon B, NP       metoprolol succinate (TOPROL-XL) 24 hr tablet 100 mg  100 mg Oral BID Park Pope, MD   100 mg at 10/05/20 0813   multivitamin with minerals tablet 1 tablet  1 tablet Oral Daily Cinderella, Margaret A   1 tablet at 10/05/20 9323   pneumococcal 23 valent vaccine (PNEUMOVAX-23) injection 0.5 mL  0.5 mL Intramuscular Tomorrow-1000 Mason Jim, Amy E, MD       QUEtiapine (SEROQUEL)  tablet 50 mg  50 mg Oral QHS Park Pope, MD   50 mg at 10/04/20 2135   sacubitril-valsartan (ENTRESTO) 97-103 mg per tablet  1 tablet Oral BID Park Pope, MD       sertraline (ZOLOFT) tablet 50 mg  50 mg Oral Daily Park Pope, MD   50 mg at 10/05/20 5573   thiamine tablet 100 mg  100 mg Oral Daily Cinderella, Margaret A   100 mg at 10/05/20 2202   Or   thiamine (B-1) injection 100 mg  100 mg Intravenous Daily Cinderella, Margaret A       PTA Medications: Medications Prior to Admission  Medication Sig Dispense Refill Last Dose   atorvastatin (LIPITOR) 40 MG tablet Take 40 mg by mouth daily.      furosemide (LASIX) 20 MG tablet Take 20 mg by mouth daily as needed for edema.      JARDIANCE 10 MG TABS tablet Take 10 mg by mouth daily.      metoprolol succinate (TOPROL-XL) 100 MG 24 hr tablet Take 100 mg by mouth 2 (two) times daily. Take with or immediately following a meal.      sacubitril-valsartan (ENTRESTO) 97-103 MG Take 1 tablet by mouth 2 (two) times daily.      spironolactone (ALDACTONE) 25 MG tablet Take 25 mg by mouth daily.       Patient  Stressors: Financial difficulties   Health problems   Marital or family conflict   Occupational concerns   Substance abuse    Patient Strengths: Ability for insight  Average or above average intelligence  Communication skills   Treatment Modalities: Medication Management, Group therapy, Case management,  1 to 1 session with clinician, Psychoeducation, Recreational therapy.   Physician Treatment Plan for Primary Diagnosis: MDD (major depressive disorder), recurrent episode, severe (HCC) Long Term Goal(s): Improvement in symptoms so as ready for discharge   Short Term Goals: Ability to identify changes in lifestyle to reduce recurrence of condition will improve Ability to verbalize feelings will improve Ability to disclose and discuss suicidal ideas Ability to demonstrate self-control will improve Ability to identify and develop effective  coping behaviors will improve Ability to maintain clinical measurements within normal limits will improve Ability to identify triggers associated with substance abuse/mental health issues will improve  Medication Management: Evaluate patient's response, side effects, and tolerance of medication regimen.  Therapeutic Interventions: 1 to 1 sessions, Unit Group sessions and Medication administration.  Evaluation of Outcomes: Progressing  Physician Treatment Plan for Secondary Diagnosis: Principal Problem:   MDD (major depressive disorder), recurrent episode, severe (HCC) Active Problems:   Alcohol use disorder, severe, dependence (HCC)   Alcohol-induced mood disorder with depressive symptoms (HCC)   Cocaine abuse (HCC)   Chronic systolic CHF (congestive heart failure) (HCC)   HTN (hypertension)   Chest pain   Substance induced mood disorder (HCC)   Marijuana use   Suicidal ideation   Homicidal ideation  Long Term Goal(s): Improvement in symptoms so as ready for discharge   Short Term Goals: Ability to identify changes in lifestyle to reduce recurrence of condition will improve Ability to verbalize feelings will improve Ability to disclose and discuss suicidal ideas Ability to demonstrate self-control will improve Ability to identify and develop effective coping behaviors will improve Ability to maintain clinical measurements within normal limits will improve Ability to identify triggers associated with substance abuse/mental health issues will improve     Medication Management: Evaluate patient's response, side effects, and tolerance of medication regimen.  Therapeutic Interventions: 1 to 1 sessions, Unit Group sessions and Medication administration.  Evaluation of Outcomes: Progressing   RN Treatment Plan for Primary Diagnosis: MDD (major depressive disorder), recurrent episode, severe (HCC) Long Term Goal(s): Knowledge of disease and therapeutic regimen to maintain health  will improve  Short Term Goals: Ability to demonstrate self-control and Ability to participate in decision making will improve  Medication Management: RN will administer medications as ordered by provider, will assess and evaluate patient's response and provide education to patient for prescribed medication. RN will report any adverse and/or side effects to prescribing provider.  Therapeutic Interventions: 1 on 1 counseling sessions, Psychoeducation, Medication administration, Evaluate responses to treatment, Monitor vital signs and CBGs as ordered, Perform/monitor CIWA, COWS, AIMS and Fall Risk screenings as ordered, Perform wound care treatments as ordered.  Evaluation of Outcomes: Progressing   LCSW Treatment Plan for Primary Diagnosis: MDD (major depressive disorder), recurrent episode, severe (HCC) Long Term Goal(s): Safe transition to appropriate next level of care at discharge, Engage patient in therapeutic group addressing interpersonal concerns.  Short Term Goals: Engage patient in aftercare planning with referrals and resources, Increase social support, and Increase ability to appropriately verbalize feelings  Therapeutic Interventions: Assess for all discharge needs, 1 to 1 time with Social worker, Explore available resources and support systems, Assess for adequacy in community support network, Educate family and significant other(s)  on suicide prevention, Complete Psychosocial Assessment, Interpersonal group therapy.  Evaluation of Outcomes: Progressing   Progress in Treatment: Attending groups: Yes. Participating in groups: Yes. Taking medication as prescribed: Yes. Toleration medication: Yes. Family/Significant other contact made: No, will contact:  pt declined Patient understands diagnosis: Yes. Discussing patient identified problems/goals with staff: Yes. Medical problems stabilized or resolved: Yes. Denies suicidal/homicidal ideation: Yes. Issues/concerns per patient  self-inventory: No. Other: None  New problem(s) identified: No, Describe:  None  New Short Term/Long Term Goal(s):medication stabilization, elimination of SI thoughts, development of comprehensive mental wellness plan.   Patient Goals:  "work on my sobriety and work on Pharmacologist with my anger."  Discharge Plan or Barriers: Patient recently admitted. CSW will continue to follow and assess for appropriate referrals and possible discharge planning.   Reason for Continuation of Hospitalization: Medication stabilization  Estimated Length of Stay: 3-5 days   Scribe for Treatment Team: Chrys Racer 10/05/2020 2:24 PM

## 2020-10-05 NOTE — [facility] Group Notes (Signed)
[facility] Group Notes:  (Nursing/MHT/Case Management/Adjunct)  Date:  10/05/2020  Time:  8:15 PM  Type of Therapy:  Group Therapy  Participation Level:  Active  Participation Quality:  Attentive  Affect:  Appropriate  Cognitive:  Appropriate  Insight:  Improving  Engagement in Group:  Developing/Improving  Modes of Intervention:  Discussion  Summary of Progress/Problems:  Lorita Officer 10/05/2020, 8:15 PM

## 2020-10-06 MED ORDER — SERTRALINE HCL 50 MG PO TABS: 50.0000 mg | ORAL_TABLET | Freq: Every day | ORAL | 0 refills | Status: AC

## 2020-10-06 MED ORDER — HYDROXYZINE HCL 25 MG PO TABS: 25.0000 mg | ORAL_TABLET | Freq: Three times a day (TID) | ORAL | 0 refills | Status: AC | PRN

## 2020-10-06 MED ORDER — QUETIAPINE FUMARATE 50 MG PO TABS: 50.0000 mg | ORAL_TABLET | Freq: Every day | ORAL | 0 refills | Status: AC

## 2020-10-06 NOTE — Progress Notes (Signed)
Upon initial interaction with pt last night, he was in a very good mood. He was dancing and smiling. Pt said that he had a good day since he had a free mind. Later he did have some irritability towards the nurses, but didn't elaborate. He has been attending groups and interacting with his peers appropriately. He reported that the previous night he slept well until 3-4 AM. After that he had trouble falling back asleep. Pt denies SI/HI and AVH. Active listening, reassurance, and support provided. Medications administered as ordered by provider. Q 15 min safety checks continue. Pt's safety has been maintained.   10/05/20 2114  Psych Admission Type (Psych Patients Only)  Admission Status Voluntary  Psychosocial Assessment  Patient Complaints Anxiety;Irritability  Eye Contact Fair  Facial Expression Animated;Anxious  Affect Apathetic;Appropriate to circumstance;Irritable  Speech Logical/coherent  Interaction Forwards little  Motor Activity Other (Comment) (steady)  Appearance/Hygiene Unremarkable  Behavior Characteristics Appropriate to situation;Cooperative;Anxious  Mood Anxious;Irritable;Pleasant  Thought Process  Coherency WDL  Content Blaming others  Delusions None reported or observed  Perception WDL  Hallucination None reported or observed  Judgment Poor  Confusion None  Danger to Self  Current suicidal ideation? Denies  Self-Injurious Behavior No self-injurious ideation or behavior indicators observed or expressed   Agreement Not to Harm Self Yes  Description of Agreement verbally contracts for safety  Danger to Others  Danger to Others None reported or observed  Danger to Others Abnormal  Harmful Behavior to others No threats or harm toward other people  Destructive Behavior No threats or harm toward property

## 2020-10-06 NOTE — Progress Notes (Signed)
  [facility] Adult Case Management Discharge Plan :  Will you be returning to the same living situation after discharge:  Yes,  brother's home At discharge, do you have transportation home?: No. Do you have the ability to pay for your medications: Yes,  medicaid  Release of information consent forms completed and in the chart;  Patient's signature needed at discharge.  Patient to Follow up at:  Follow-up Information     [facility]. Call.   Why: Please call to schedule an appointment with this provider for grief/bereavement therapy services. Contact information: [address], [place], [place] [address]  Phone: [phone]        [facility] Follow up.   Specialty: Addiction Medicine Why: You have been accepted to this facility and placed on the waitlist. Please call this facility daily to see about bed openings. Contact information: [address] [place] [address] [phone]         [facility]. Go to.   Why: Please go to this provider for substance use intensive outpatient therapy services during walk in hours:  Monday through Friday, 6:00 am to 10:30 am. Contact information: [address] [place], [place] [address]  P: [phone] F: [phone]        [facility]. Go to.   Specialty: Behavioral Health Why: Please go to this provider for therapy and medication management services during walk in hours:  Monday through Thursday, from 7:45 am to 11:00 am.  Services are provided on a first come, first served basis. Contact information: [address] [place] [address] [phone]        [facility] Follow up.   Why: Referral made Contact information: [address] [place] [address] [phone]         [facility]. Call.   Specialty: Hospice and Palliative Medicine Why: Please call to personally schedule an  appointment for bereavement/grief therapy services in the [place] area. Contact information: [address] [facility] [place] [address] [phone]                Next level of care provider has access to [facility] Link:yes  Safety Planning and Suicide Prevention discussed: Yes,  w/ pt     Has patient been referred to the Quitline?: Patient refused referral  Patient has been referred for addiction treatment: Yes  Felizardo Hoffmann, LCSWA 10/06/2020, 12:03 PM

## 2020-10-06 NOTE — [facility] Group Notes (Signed)
[facility] Group Notes:  (Nursing/MHT/Case Management/Adjunct)  Date:  10/06/2020  Time:  9:39 AM  Type of Therapy:  Group Therapy  Participation Level:  Active  Participation Quality:  Appropriate  Affect:  Appropriate  Cognitive:  Appropriate  Insight:  Appropriate  Engagement in Group:  Engaged  Modes of Intervention:  Discussion  Summary of Progress/Problems:Pt was engaged in group.  Jaquita Rector 10/06/2020, 9:39 AM

## 2020-10-06 NOTE — Discharge Summary (Addendum)
Physician Discharge Summary Note  Patient:  Bruce Moreno is an 50 y.o., male MRN:  [id number] DOB:  08-May-1970 Patient phone:  [phone] (home)  Patient address:   [address] [place] [address],  Total Time spent with patient:  I personally spent 60 minutes on the unit in direct patient care. The direct patient care time included face-to-face time with the patient, reviewing the patient's chart, communicating with other professionals, and coordinating care. Greater than 50% of this time was spent in counseling or coordinating care with the patient regarding goals of hospitalization, psycho-education, and discharge planning needs.   Date of Admission:  09/28/2020 Date of Discharge: 10/06/2020  Reason for Admission:  Patient is a 50 yo male w/ hx of alcohol use disorder-severe, cocaine abuse, benzodiazapine abuse, MDD, HTN, dyslipidemia, HFrEF presents to [facility] from [facility] due to worsening depression, SI, and HI.   CHART REVIEW: Patient appears to have multiple ED visits for substance-induced mood disorder and alcohol abuse in August 2022.  Patient does not have any documentation via epic regarding mental health problems prior to 2022.  PER H&P "Patient is a 50 yo male w/ hx of alcohol use disorder-severe, cocaine abuse, benzodiazapine abuse, MDD, HTN, dyslipidemia, HFrEF presents to [facility] from [facility] due to worsening depression, SI, and HI. Patient states that "truthfully, I am here because I relapsed". Patient stated this was triggered when his son died Mar 23, 2020("when he died, I felt like I died"). Since then, patient has had quit going to AA meetings (was going to 3-4/week before) and he fell out of contact with many of his supports for sobriety. In addition to this, patient reports additional stressor of his ex-girlfriend separating from him and preventing him from seeing his son that they conceived. This culminated in patient attempting to kill himself this weekend ("I was planning to  snort a line of fentanyl and drink myself to death"). Patient's brother went to hotel patient had been in and took him to [facility]. Patient had never before this year used illicit substances including fentanyl, marijuana and crack/cocaine.   Patient states that while his HI is towards everyone ("I feel so angry some times I want to kill everyone"), patient's HI is mainly towards ex-girlfriend. Patient fears that he would have choked the life out of his ex-girlfriend and then killed himself if he had seen her this weekend. Patient states that he has had problems with "anger management" as patient states he is quick-tempered and unable to appropriately control emotions. Patient has been previously incarcerated for 7 years for shooting a person. Patient interested in counseling for his anger and grief.     Patient has been dealing with substance use for a while now. He began drinking around the age of 18. Had never smoked weed or done drugs until this year. Patient's dad's death triggered the drinking. Patient began drinking heavily when he had an infant son die at age 14 (has lost 2 sons total). Patient did have a 3 year period of sobriety after doing medical detox and attending AA meetings. Patient states he had actual been leading group meetings during AA meetings at time. Since relapse, patient has been drinking every day and had poor decision making while drinking including driving. Patient states there are many days where he will wake up in bed and be uncertain how he got there.   Patient does endorse eating less lately due to patient's binge of drugs and drinking this past weekend but eats regularly. Patient  states he has not been sleeping well due to anxiety but denies times where he has felt lack of need for sleep.    Patient's medical problems include a heart attack a few months ago, bulging disc that patient is on disability for, and recently discovered HTN. Patient also endorses he has daily pounding  headaches and intermittent chest pain that worsens with exertion.    Patient states he is primarily looking for therapy/counseling. Recommended Zoloft for antidepressant but patient was hesitant about medication as patient was concerned about sexual dysfunction side effect. Wants some time to think and is open to revisiting this tomorrow.    Patient denies EtOH withdrawal but feels that he went through cravings/withdrawal this weekend. No tremor. No seizure or hallucinations with past withdrawal. Pulse has been a little high this AM. Last drink was Sunday at 4 AM.    Patient denies access to guns and states "if I had a gun I wouldn't be here right now". "   Principal Problem: MDD (major depressive disorder), recurrent episode, severe (HCC) Discharge Diagnoses: Principal Problem:   MDD (major depressive disorder), recurrent episode, severe (HCC) Active Problems:   Alcohol use disorder, severe, dependence (HCC)   Alcohol-induced mood disorder with depressive symptoms (HCC)   Cocaine abuse (HCC)   Chronic systolic CHF (congestive heart failure) (HCC)   HTN (hypertension)   Chest pain   Substance induced mood disorder (HCC)   Marijuana use   Suicidal ideation   Homicidal ideation   Past Psychiatric History: see H&P  Past Medical History:  Past Medical History:  Diagnosis Date   Chronic systolic CHF (congestive heart failure) (HCC)    Echo at [facility]/16/20: EF 25-30%, grade I diastolic dysfunction, no pulmonary hypertension.   Hypercholesteremia    Hypertension     Past Surgical History:  Procedure Laterality Date   CARDIAC CATHETERIZATION  05/18/2020   No significant coronary artery disease. Nomral right and left sided filling pressures. Low-normal cardiac output.   Family History:  Family History  Problem Relation Age of Onset   Hypertension Mother    Cancer Father    Heart attack Neg Hx    Family Psychiatric  History: see H&P Social History:  Social History    Substance and Sexual Activity  Alcohol Use Yes   Alcohol/week: 4.0 standard drinks   Types: 4 Standard drinks or equivalent per week   Comment: Quit from 2019-2022 but relapsed 09/2020.     Social History   Substance and Sexual Activity  Drug Use Yes   Types: Cocaine    Social History   Socioeconomic History   Marital status: Single    Spouse name: Not on file   Number of children: Not on file   Years of education: Not on file   Highest education level: Not on file  Occupational History   Not on file  Tobacco Use   Smoking status: Former    Packs/day: 0.10    Years: 0.50    Pack years: 0.05    Types: Cigarettes    Start date: 03/11/2020    Quit date: 07/22/2020    Years since quitting: 0.2   Smokeless tobacco: Never  Vaping Use   Vaping Use: Never used  Substance and Sexual Activity   Alcohol use: Yes    Alcohol/week: 4.0 standard drinks    Types: 4 Standard drinks or equivalent per week    Comment: Quit from 2019-2022 but relapsed 09/2020.   Drug use: Yes  Types: Cocaine   Sexual activity: Not Currently  Other Topics Concern   Not on file  Social History Narrative   Not on file   Social Determinants of Health   Financial Resource Strain: Not on file  Food Insecurity: Not on file  Transportation Needs: Not on file  Physical Activity: Not on file  Stress: Not on file  Social Connections: Not on file    Hospital Course:    After the above admission evaluation, patient's presenting symptoms were noted. Patient was recommended for antidepressant treatment. The medication regimen targeting those presenting symptoms were discussed with her & initiated with his consent. Patient was started on Zoloft 50 mg daily, and trazodone 50 mg daily and increase to 100 mg daily due to continued insomnia.  Patient was then discontinued from trazodone due to having vivid dreams started on Seroquel 50 mg daily.  Patient was placed on CIWA protocol but did not require any  PRNs.  Patient had no complaints throughout the hospitalization and felt that the medication regiment was appropriate. Patient endorsed some mild chest pain when stressed.  Internal medicine was consulted and stated that this was unlikely to be of cardiac etiology and more likely neuromuscular.  Pertinent labs drawn during hospitalizations include: Mildly elevated D-dimer of 0.6, normal troponins normal BNP normal magnesium, normal BMP, normal lipid panel, and mildly increased A1c of 5.7.   During the course of her hospitalization, the 15-minute checks were adequate to ensure patient's safety. Patient did not exhibit erratic or aggressive behavior and was compliant with scheduled medication. He was recommended for outpatient residential treatment facilities.  At the time of discharge patient is not reporting any acute suicidal/homicidal ideations/AVH, delusional thoughts or paranoia. He does not appear to be responding to any internal stimuli. He feels more confident about his self-care & in managing his mental health. He currently denies any new issues or concerns. Education and supportive counseling provided throughout his hospital stay & upon discharge.   Today upon his discharge evaluation with the attending psychiatrist Dr. Gasper Sells, patient states he is doing " good". Patient denies any specific concerns. Patient slept well, appetite good, regular bowel movements. Patient denies any physical complaints. He feels that his medications have been helpful & is in agreement to continue his current treatment regimen as recommended. He was able to engage in safety planning including plan to return to [facility] or contact emergency services if he feels unable to maintain he own safety or the safety of others. Pt had no further questions, comments, or concerns. He left [facility] with all personal belongings in no apparent distress. Transportation per safe prior to transport to brother's home was arranged for  patient.  Physical Findings:   Musculoskeletal: Strength & Muscle Tone: within normal limits Gait & Station: normal Patient leans: N/A   Psychiatric Specialty Exam:  Presentation  General Appearance: Casual; Appropriate for Environment  Eye Contact:Good  Speech:Clear and Coherent  Speech Volume:Normal  Handedness:Right   Mood and Affect  Mood:-- (good, hopeful)  Affect:Congruent   Thought Process  Thought Processes:Coherent  Descriptions of Associations:Intact  Orientation:Full (Time, Place and Person)  Thought Content:Logical  History of Schizophrenia/Schizoaffective disorder:No  Duration of Psychotic Symptoms:No data recorded Hallucinations:Hallucinations: None  Ideas of Reference:None  Suicidal Thoughts:Suicidal Thoughts: No  Homicidal Thoughts:Homicidal Thoughts: No   Sensorium  Memory:Immediate Fair; Recent Good; Immediate Good; Remote Good  Judgment:Poor (Has been accusing social work of not working on discharge, generally splitting with staff, and disruptive to milieu.)  Insight:Fair  Executive Functions  Concentration:Good  Attention Span:Good  Recall:Good  [employer] of Knowledge:Good  Language:Good   Psychomotor Activity  Psychomotor Activity:No data recorded   Marketing executive; Desire for Improvement   Sleep  Sleep:Sleep: Good    Physical Exam: Physical Exam Vitals and nursing note reviewed.  Constitutional:      Appearance: Normal appearance. He is normal weight.  HENT:     Head: Normocephalic and atraumatic.  Pulmonary:     Effort: Pulmonary effort is normal.  Neurological:     General: No focal deficit present.     Mental Status: He is oriented to person, place, and time.   Review of Systems  Respiratory:  Negative for shortness of breath.   Cardiovascular:  Negative for chest pain.  Gastrointestinal:  Negative for abdominal pain, constipation, diarrhea, heartburn, nausea and vomiting.   Neurological:  Negative for headaches.  Blood pressure (!) 112/93, pulse 85, temperature 99 F (37.2 C), temperature source Oral, resp. rate 18, height 5\' 6"  (1.676 m), SpO2 96 %. Body mass index is 30.96 kg/m.   Social History   Tobacco Use  Smoking Status Former   Packs/day: 0.10   Years: 0.50   Pack years: 0.05   Types: Cigarettes   Start date: 03/11/2020   Quit date: 07/22/2020   Years since quitting: 0.2  Smokeless Tobacco Never   Tobacco Cessation:  N/A, patient does not currently use tobacco products   Blood Alcohol level:  Lab Results  Component Value Date   ETH 230 (H) 09/25/2020   ETH <10 09/12/2020    Metabolic Disorder Labs:  Lab Results  Component Value Date   HGBA1C 5.7 (H) 10/05/2020   MPG 116.89 10/05/2020   No results found for: PROLACTIN Lab Results  Component Value Date   CHOL 141 10/05/2020   TRIG 99 10/05/2020   HDL 36 (L) 10/05/2020   CHOLHDL 3.9 10/05/2020   VLDL 20 10/05/2020   LDLCALC 85 10/05/2020    See Psychiatric Specialty Exam and Suicide Risk Assessment completed by Attending Physician prior to discharge.  Discharge destination:  Home  Is patient on multiple antipsychotic therapies at discharge:  No   Has Patient had three or more failed trials of antipsychotic monotherapy by history:  No  Recommended Plan for Multiple Antipsychotic Therapies: NA   Allergies as of 10/06/2020   No Known Allergies      Medication List     STOP taking these medications    furosemide 20 MG tablet Commonly known as: LASIX   spironolactone 25 MG tablet Commonly known as: ALDACTONE       TAKE these medications      Indication  atorvastatin 40 MG tablet Commonly known as: LIPITOR Take 40 mg by mouth daily.  Indication: Cardiac Failure   Entresto 97-103 MG Generic drug: sacubitril-valsartan Take 1 tablet by mouth 2 (two) times daily.  Indication: Cardiac Failure   hydrOXYzine 25 MG tablet Commonly known as:  ATARAX/VISTARIL Take 1 tablet (25 mg total) by mouth 3 (three) times daily as needed for anxiety.  Indication: Feeling Anxious   Jardiance 10 MG Tabs tablet Generic drug: empagliflozin Take 10 mg by mouth daily.  Indication: Cardiac Failure   metoprolol succinate 100 MG 24 hr tablet Commonly known as: TOPROL-XL Take 100 mg by mouth 2 (two) times daily. Take with or immediately following a meal.  Indication: High Blood Pressure Disorder   QUEtiapine 50 MG tablet Commonly known as: SEROQUEL Take 1 tablet (50 mg total)  by mouth at bedtime.  Indication: insomnia   sertraline 50 MG tablet Commonly known as: ZOLOFT Take 1 tablet (50 mg total) by mouth daily.  Indication: Major Depressive Disorder        Follow-up Information     [facility]. Call.   Why: Please call to schedule an appointment with this provider for grief/bereavement therapy services. Contact information: [address], [place], [place] [address]  Phone: [phone]        [facility] Follow up.   Specialty: Addiction Medicine Why: You have been accepted to this facility and placed on the waitlist. Please call this facility daily to see about bed openings. Contact information: [address] [place] [address] [phone]         [facility]. Go to.   Why: Please go to this provider for substance use intensive outpatient therapy services during walk in hours:  Monday through Friday, 6:00 am to 10:30 am. Contact information: [address] [place], [place] [address]  P: [phone] F: [phone]        [facility]. Go to.   Specialty: Behavioral Health Why: Please go to this provider for therapy and medication management services during walk in hours:  Monday through Thursday, from 7:45 am to 11:00 am.  Services are provided on a first come, first served basis. Contact information: [address]  [place] [address] [phone]        [facility] Follow up.   Why: You have been placed on the waitlist for this facility. Please call daily for bed openings. Contact information: [address] [place] [address] [phone]         [facility]. Call.   Specialty: Hospice and Palliative Medicine Why: Please call to personally schedule an appointment for bereavement/grief therapy services in the [place] area. Contact information: [address] [place] [address] [phone]                Follow-up recommendations:   Activity:  as tolerated Diet:  heart healthy   Comments:  Prescriptions were given at discharge.  Patient is agreeable with the discharge plan.  He was given an opportunity to ask questions.  He appears to feel comfortable with discharge and denies any current suicidal or homicidal thoughts.    Patient is instructed prior to discharge to: Take all medications as prescribed by her mental healthcare provider. Report any adverse effects and or reactions from the medicines to her outpatient provider promptly. In the event of worsening symptoms, patient is instructed to call the crisis hotline, 911 and or go to the nearest ED for appropriate evaluation and treatment of symptoms. Patient is to follow-up with her primary care provider for other medical issues, concerns and or health care needs.   Signed: Park Pope, MD 10/07/2020, 6:26 PM

## 2020-10-06 NOTE — [facility] Suicide Risk Assessment (Signed)
[facility] Discharge Suicide Risk Assessment  I saw patient with Dr. Hazle Quant and was present for the entirety of his assessment - he later confronted patient on his negative/inflammatory statements about his social worker alone which I agree with and discussed with him. Patient denying all suicidality, homicidality and states his mood has been good. He is highly motivated to go to alcohol rehab for further treatment and is on the wait list at both [facility] and [facility] and is hoping to go to an [facility] in the interim - has an interview at 5; future oriented throughout discussion. He is worried that he will relapse if he goes to his brother's house in the interim. We discussed his insurance status (no longer covering this stay) and offered to do Bruce peer-to-peer to try to extend his paid status (or keep in hospital regardless); patient ultimately preferred to dc to day to either go to [facility] or with brother. He does not meet involuntary hospitalization status in the state of [place] (and, as above, has been denying SI/HI/AVH for several days) and I feel the benefits of keeping him inpatient until sober living is secured outweigh the known financial risks of an uncovered hospital stay and alienating him from the mental health system - will honor verbal request to leave today.   On day of discharge following sustained improvement in the affect of this patient, continued report of euthymic mood, repeated denial of suicidal, homicidal, and other violent ideation, adequate interaction with peers, active participation in groups while on the unit, and denial of adverse reactions from medications, the treatment team decided Bruce Moreno was stable for discharge home with scheduled mental health treatment as noted above.   He was able to engage in safety planning including plan to return to [facility] or contact emergency services if he feels unable to maintain his own safety or the safety of others. Patient had no further  questions, comments, or concerns. Discharge into care of self.   Principal Problem: MDD (major depressive disorder), recurrent episode, severe (HCC) Discharge Diagnoses: Principal Problem:   MDD (major depressive disorder), recurrent episode, severe (HCC) Active Problems:   Alcohol use disorder, severe, dependence (HCC)   Alcohol-induced mood disorder with depressive symptoms (HCC)   Cocaine abuse (HCC)   Chronic systolic CHF (congestive heart failure) (HCC)   HTN (hypertension)   Chest pain   Substance induced mood disorder (HCC)   Marijuana use   Suicidal ideation   Homicidal ideation   Total Time spent with patient: 20 minutes  Musculoskeletal: Strength & Muscle Tone: within normal limits Gait & Station: normal Patient leans: N/Bruce  Psychiatric Specialty Exam  Presentation  General Appearance: Casual; Appropriate for Environment Eye Contact:Good Speech:Clear and Coherent Speech Volume:Normal Handedness:Right   Mood and Affect  Mood:-- (good, hopeful) Duration of Depression Symptoms: Greater than two weeks Affect:Congruent   Thought Process  Thought Processes:Coherent Descriptions of Associations:Intact Orientation:Full (Time, Place and Person) Thought Content:Logical History of Schizophrenia/Schizoaffective disorder:No Duration of Psychotic Symptoms:No data recorded Hallucinations:Hallucinations: None Ideas of Reference:None Suicidal Thoughts:Suicidal Thoughts: No Homicidal Thoughts:Homicidal Thoughts: No   Sensorium  Memory:Immediate Fair; Recent Good; Immediate Good; Remote Good Judgment:Poor (Has been accusing social work of not working on discharge, generally splitting with staff, and disruptive to milieu.) Insight:Fair   Executive Functions  Concentration:Good Attention Span:Good Recall:Good Fund of Knowledge:Good Language:Good   Psychomotor Activity  Psychomotor Activity:Psychomotor Activity: Normal   Assets  Assets:Communication  Skills; Desire for Improvement   Sleep  Sleep:Sleep: Good Number  of Hours of Sleep: 6.25   Physical Exam: Physical Exam HENT:     Head: Normocephalic.     Ears:     Comments: Hearing grossly intact Eyes:     General: No scleral icterus.    Conjunctiva/sclera: Conjunctivae normal.  Pulmonary:     Effort: Pulmonary effort is normal.  Musculoskeletal:     Cervical back: Normal range of motion.  Skin:    Findings: No rash.  Neurological:     Mental Status: He is alert.   ROS Blood pressure (!) 112/93, pulse 85, temperature 99 F (37.2 C), temperature source Oral, resp. rate 18, height 5\' 6"  (1.676 m), SpO2 96 %. Body mass index is 30.96 kg/m.  Mental Status Per Nursing Assessment::   On Admission:  Suicidal ideation indicated by patient, Plan includes specific time, place, or method, Self-harm thoughts  Demographic Factors:  Male, separated from significant other, low socioeconomic status   Loss Factors: Loss of significant relationship  Historical Factors: Family history of mental illness or substance abuse and Impulsivity  Risk Reduction Factors:   Responsible for children under 39 years of age, Sense of responsibility to family, and Positive coping skills or problem solving skills  Continued Clinical Symptoms:  Depression:   Comorbid alcohol abuse/dependence  Cognitive Features That Contribute To Risk:  Polarized thinking    Suicide Risk:  Mild:  Suicidal ideation of limited frequency, intensity, duration, and specificity.  There are no identifiable plans, no associated intent, mild dysphoria and related symptoms, good self-control (both objective and subjective assessment), few other risk factors, and identifiable protective factors, including available and accessible social support.   Follow-up Information     [facility]. Call.   Why: Please call to schedule an appointment with this provider for grief/bereavement therapy  services. Contact information: [address] [place], [place]  Phone: [phone]        [facility] Follow up.   Specialty: Addiction Medicine Why: You have been accepted to this facility and placed on the waitlist. Please call this facility daily to see about bed openings. Contact information: [address] [place] [phone]         [facility]. Go to.   Why: Please go to this provider for substance use intensive outpatient therapy services during walk in hours:  Monday through Friday, 6:00 am to 10:30 am. Contact information: [address] [place], [place]  P: [phone] F: [phone]        [facility]. Go to.   Specialty: Behavioral Health Why: Please go to this provider for therapy and medication management services during walk in hours:  Monday through Thursday, from 7:45 am to 11:00 am.  Services are provided on Bruce first come, first served basis. Contact information: [address] [place] [phone]        [facility] Follow up.   Why: You have been placed on the waitlist for this facility. Please call daily for bed openings. Contact information: [address] [place] [phone]         [facility]. Call.   Specialty: Hospice and Palliative Medicine Why: Please call to personally schedule an appointment for bereavement/grief therapy services in the [place] area. Contact information: [address] [place] [phone]                Plan Of Care/Follow-up  recommendations:  Activity:  unrestricted, follow cardiology's recommendations Diet:  follow cardiology's recommendations Tests:  will need A1c/lipid panel as outpatient  See discharge summary by Dr. Hazle Quant for full plan.   Bruce Moreno Bruce Moreno 10/06/2020, 12:25 PM

## 2020-10-06 NOTE — Progress Notes (Signed)
RN met with pt and reviewed pt's discharge instructions.  Pt verbalized understanding of discharge instructions and pt did not have any questions. RN reviewed and provided pt with a copy of SRA, AVS and Transition Record.  RN returned pt's belongings to pt.  Paper medication prescriptions were given to pt.  Pt denied SI/HI/AVH and voiced no concerns.  Pt was appreciative of the care pt received at [facility].  Patient discharged to the lobby without incident.  

## 2020-10-06 NOTE — [facility] Counselor (Signed)
CSW called [facility] and confirmed that pt is still on the waitlist and will need to continue to call daily to check on bed openings.   Fredirick Lathe, LCSWA Clinicial Social Worker [employer]

## 2023-04-10 IMAGING — CR DG CHEST 2V
2 series · 2 of 2 positions shown · non-contrast
Comparison: None.

CLINICAL DATA: Suicidal thoughts.

EXAM:
CHEST - 2 VIEW

[chest lat]
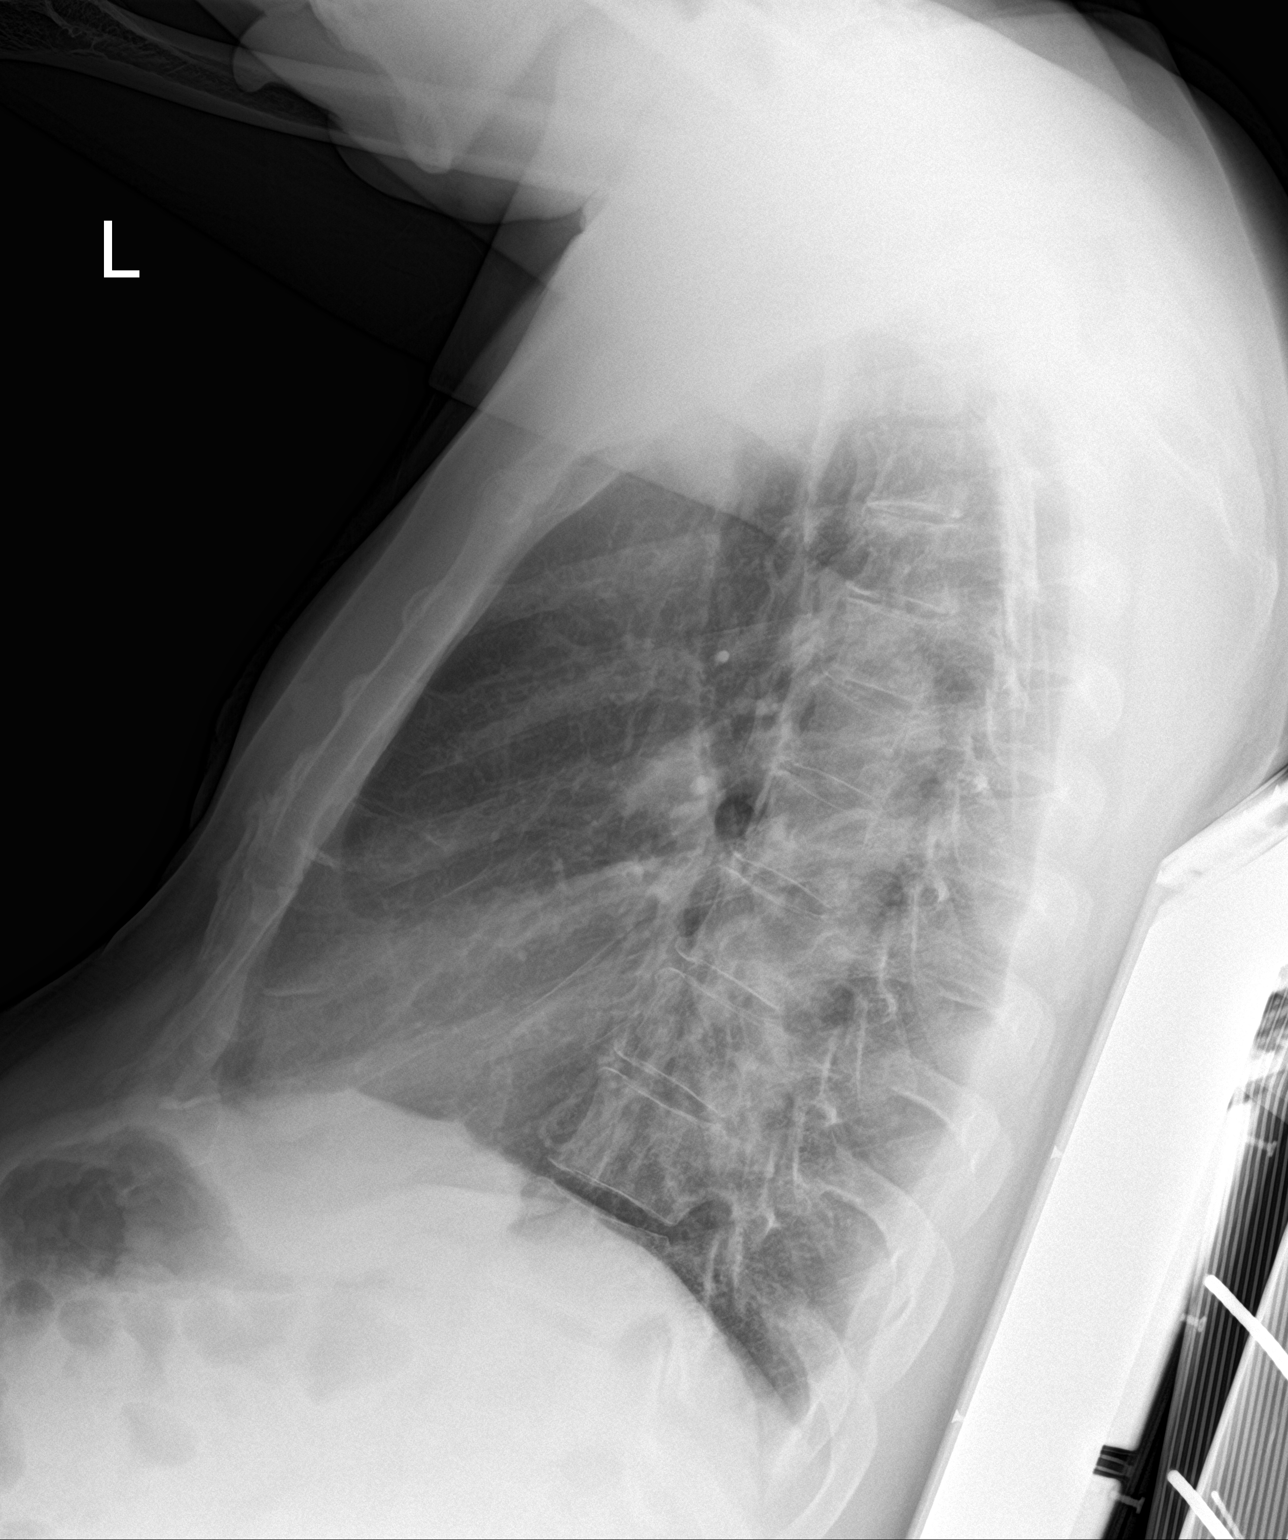

[chest ap]
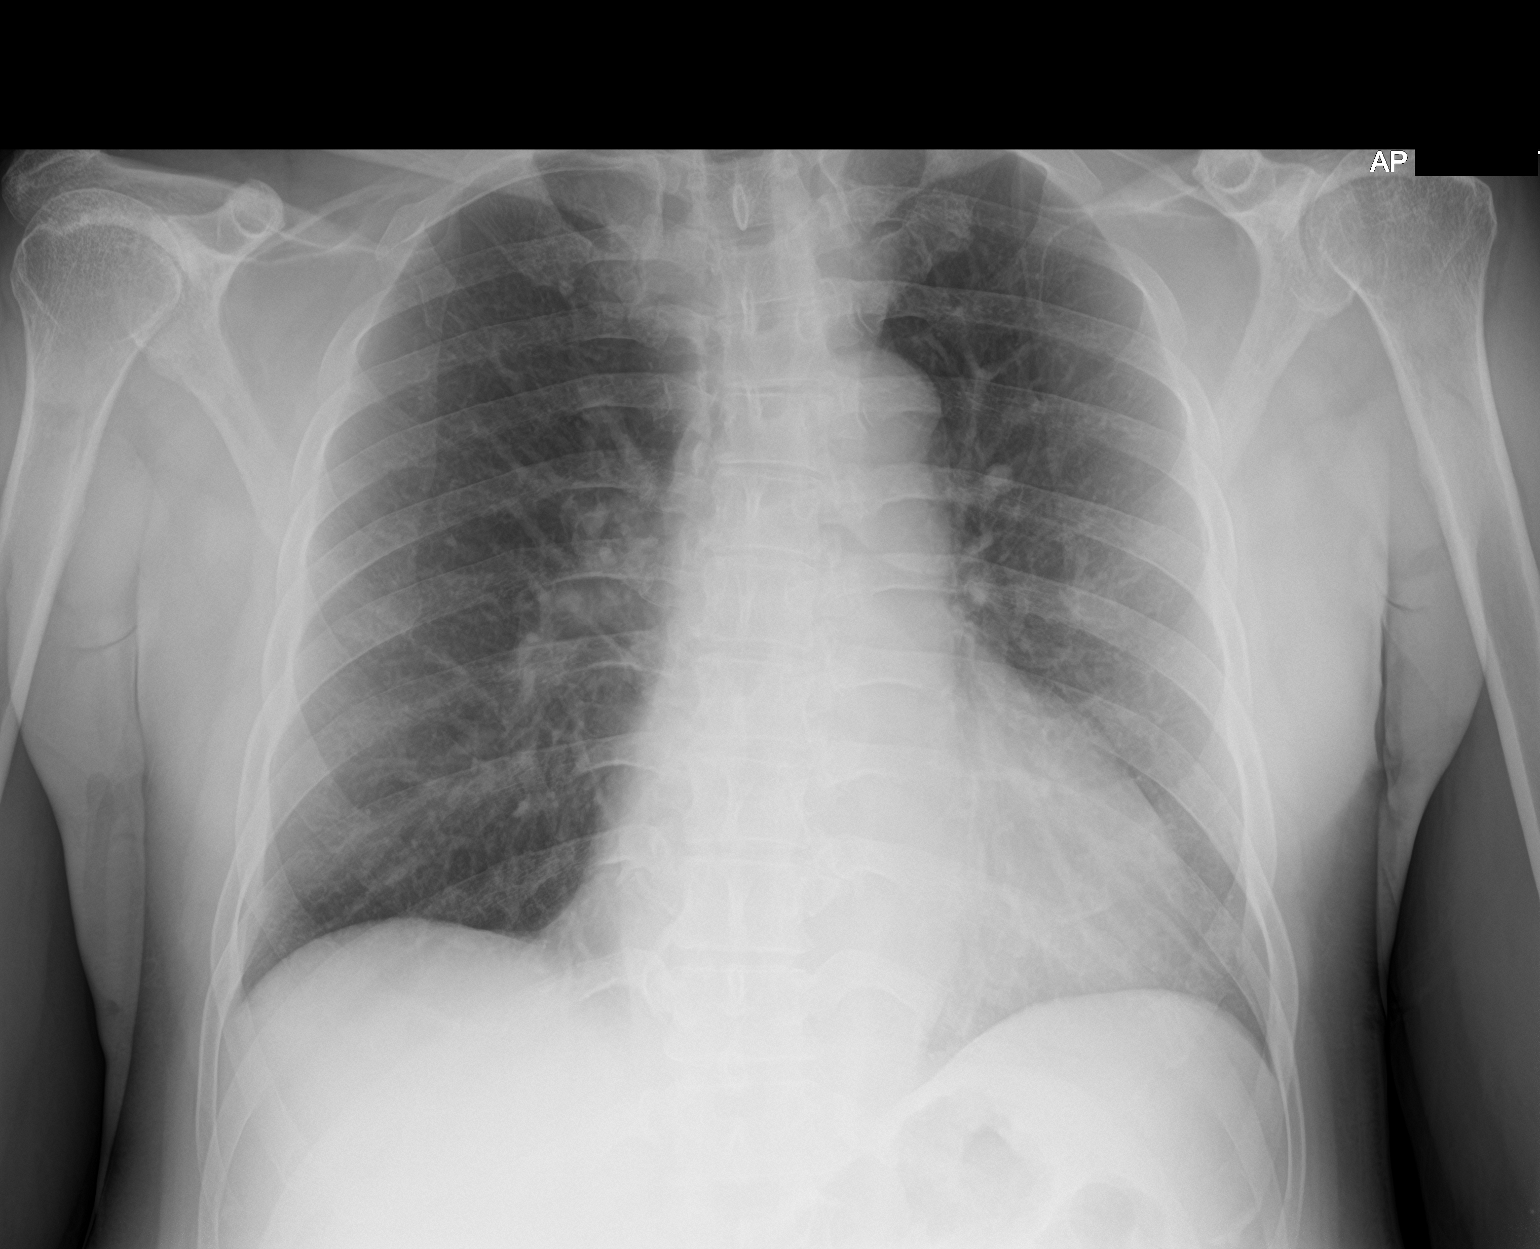

[2 of 2 positions shown; findings below may reference images not displayed]

FINDINGS: The lungs are mildly hyperinflated. There is no evidence of acute
infiltrate, pleural effusion or pneumothorax. The cardiac silhouette
is borderline in size. The visualized skeletal structures are
unremarkable.
IMPRESSION: No active cardiopulmonary disease.
